# Patient Record
Sex: Female | Born: 1945
Health system: Southern US, Community
[De-identification: ages and names within clinical notes are randomized; demographics above are authoritative.]

## PROBLEM LIST (undated history)

## (undated) DIAGNOSIS — E559 Vitamin D deficiency, unspecified: Secondary | ICD-10-CM

## (undated) DIAGNOSIS — I422 Other hypertrophic cardiomyopathy: Secondary | ICD-10-CM

## (undated) DIAGNOSIS — F419 Anxiety disorder, unspecified: Secondary | ICD-10-CM

## (undated) DIAGNOSIS — C679 Malignant neoplasm of bladder, unspecified: Secondary | ICD-10-CM

## (undated) DIAGNOSIS — F32A Depression, unspecified: Secondary | ICD-10-CM

## (undated) DIAGNOSIS — N390 Urinary tract infection, site not specified: Secondary | ICD-10-CM

## (undated) DIAGNOSIS — G5602 Carpal tunnel syndrome, left upper limb: Secondary | ICD-10-CM

## (undated) DIAGNOSIS — K219 Gastro-esophageal reflux disease without esophagitis: Secondary | ICD-10-CM

## (undated) DIAGNOSIS — E119 Type 2 diabetes mellitus without complications: Secondary | ICD-10-CM

## (undated) DIAGNOSIS — K632 Fistula of intestine: Secondary | ICD-10-CM

## (undated) DIAGNOSIS — D509 Iron deficiency anemia, unspecified: Secondary | ICD-10-CM

## (undated) DIAGNOSIS — J449 Chronic obstructive pulmonary disease, unspecified: Secondary | ICD-10-CM

## (undated) DIAGNOSIS — E785 Hyperlipidemia, unspecified: Secondary | ICD-10-CM

## (undated) DIAGNOSIS — M1990 Unspecified osteoarthritis, unspecified site: Secondary | ICD-10-CM

## (undated) DIAGNOSIS — I10 Essential (primary) hypertension: Secondary | ICD-10-CM

## (undated) DIAGNOSIS — F329 Major depressive disorder, single episode, unspecified: Secondary | ICD-10-CM

## (undated) HISTORY — DX: Urinary tract infection, site not specified: N39.0

## (undated) HISTORY — DX: Iron deficiency anemia, unspecified: D50.9

## (undated) HISTORY — DX: Carpal tunnel syndrome, left upper limb: G56.02

## (undated) HISTORY — DX: Gastro-esophageal reflux disease without esophagitis: K21.9

## (undated) HISTORY — DX: Anxiety disorder, unspecified: F41.9

## (undated) HISTORY — PX: ESOPHAGEAL DILATION: SHX303

## (undated) HISTORY — DX: Hyperlipidemia, unspecified: E78.5

## (undated) HISTORY — DX: Vitamin D deficiency, unspecified: E55.9

## (undated) HISTORY — PX: CHOLECYSTECTOMY: SHX55

## (undated) HISTORY — DX: Other hypertrophic cardiomyopathy: I42.2

## (undated) HISTORY — DX: Malignant neoplasm of bladder, unspecified: C67.9

## (undated) HISTORY — DX: Chronic obstructive pulmonary disease, unspecified: J44.9

## (undated) HISTORY — DX: Essential (primary) hypertension: I10

## (undated) HISTORY — DX: Fistula of intestine: K63.2

## (undated) HISTORY — DX: Major depressive disorder, single episode, unspecified: F32.9

## (undated) HISTORY — PX: TOTAL HIP ARTHROPLASTY: SHX124

## (undated) HISTORY — DX: Unspecified osteoarthritis, unspecified site: M19.90

## (undated) HISTORY — DX: Type 2 diabetes mellitus without complications: E11.9

## (undated) HISTORY — DX: Depression, unspecified: F32.A

---

## 2010-05-29 DIAGNOSIS — K219 Gastro-esophageal reflux disease without esophagitis: Secondary | ICD-10-CM | POA: Insufficient documentation

## 2010-05-29 DIAGNOSIS — E118 Type 2 diabetes mellitus with unspecified complications: Secondary | ICD-10-CM | POA: Insufficient documentation

## 2010-05-29 DIAGNOSIS — I10 Essential (primary) hypertension: Secondary | ICD-10-CM | POA: Insufficient documentation

## 2010-05-29 DIAGNOSIS — E559 Vitamin D deficiency, unspecified: Secondary | ICD-10-CM | POA: Insufficient documentation

## 2010-05-29 DIAGNOSIS — E785 Hyperlipidemia, unspecified: Secondary | ICD-10-CM | POA: Insufficient documentation

## 2010-05-29 DIAGNOSIS — D500 Iron deficiency anemia secondary to blood loss (chronic): Secondary | ICD-10-CM | POA: Insufficient documentation

## 2010-05-29 DIAGNOSIS — N390 Urinary tract infection, site not specified: Secondary | ICD-10-CM | POA: Insufficient documentation

## 2010-05-29 DIAGNOSIS — N3941 Urge incontinence: Secondary | ICD-10-CM | POA: Insufficient documentation

## 2010-05-29 DIAGNOSIS — F419 Anxiety disorder, unspecified: Secondary | ICD-10-CM | POA: Insufficient documentation

## 2010-05-29 DIAGNOSIS — F32A Depression, unspecified: Secondary | ICD-10-CM | POA: Insufficient documentation

## 2010-05-29 DIAGNOSIS — F329 Major depressive disorder, single episode, unspecified: Secondary | ICD-10-CM

## 2015-05-08 DIAGNOSIS — Z720 Tobacco use: Secondary | ICD-10-CM | POA: Insufficient documentation

## 2015-05-08 DIAGNOSIS — D751 Secondary polycythemia: Secondary | ICD-10-CM | POA: Insufficient documentation

## 2017-09-10 DIAGNOSIS — J449 Chronic obstructive pulmonary disease, unspecified: Secondary | ICD-10-CM | POA: Insufficient documentation

## 2017-09-13 DIAGNOSIS — Z6841 Body Mass Index (BMI) 40.0 and over, adult: Secondary | ICD-10-CM

## 2017-10-17 DIAGNOSIS — I5032 Chronic diastolic (congestive) heart failure: Secondary | ICD-10-CM | POA: Insufficient documentation

## 2017-10-17 DIAGNOSIS — I421 Obstructive hypertrophic cardiomyopathy: Secondary | ICD-10-CM | POA: Insufficient documentation

## 2018-09-22 ENCOUNTER — Telehealth: Payer: Self-pay

## 2018-09-22 NOTE — Telephone Encounter (Signed)
Dr. Larose Kells did approve these. Please schedule visits at her convenience and her husbands convenience please.

## 2018-09-22 NOTE — Telephone Encounter (Signed)
Appt scheduled 10/27/2018.

## 2018-09-22 NOTE — Telephone Encounter (Signed)
Copied from [place] [id number]. Topic: Appointment Scheduling - Scheduling Inquiry for Clinic >> Sep 22, 2018  1:01 PM Sheran Luz wrote: Reason for CRM: Patient states that she and her husband were both accepted as new patients by Dr. Larose Kells through her daughter and son in law who are currently patients. I could find no documentation in either chart [name] and Corene Cornea Mieding). Please contact patient to schedule?

## 2018-10-04 NOTE — Telephone Encounter (Addendum)
Pt and spouse called in to follow up on apt request. Advised pt to have Med records sent over to Dr. Larose Kells. Called office to be advised about apt and was told that pt and spouse would need to be scheduled after May 15, made pt aware. They would like to be advised on what should they do about their Rx medications meanwhile awaiting apt's?  ALSO, they would like to have apt back to back if possible.   Please assist with scheduling. Pt's spouse MRN is [id number]   [phone]

## 2018-10-04 NOTE — Telephone Encounter (Signed)
Appt scheduled 10/25/2018. Okay to bring Pt and husband into clinic for visits as long as they are not having resp symptoms.

## 2018-10-25 ENCOUNTER — Other Ambulatory Visit: Payer: Self-pay

## 2018-10-25 ENCOUNTER — Ambulatory Visit (INDEPENDENT_AMBULATORY_CARE_PROVIDER_SITE_OTHER): Payer: Medicare Other | Admitting: Internal Medicine

## 2018-10-25 ENCOUNTER — Encounter: Payer: Self-pay | Admitting: Internal Medicine

## 2018-10-25 DIAGNOSIS — F419 Anxiety disorder, unspecified: Secondary | ICD-10-CM | POA: Diagnosis not present

## 2018-10-25 DIAGNOSIS — F329 Major depressive disorder, single episode, unspecified: Secondary | ICD-10-CM

## 2018-10-25 DIAGNOSIS — I421 Obstructive hypertrophic cardiomyopathy: Secondary | ICD-10-CM

## 2018-10-25 DIAGNOSIS — D500 Iron deficiency anemia secondary to blood loss (chronic): Secondary | ICD-10-CM

## 2018-10-25 DIAGNOSIS — I10 Essential (primary) hypertension: Secondary | ICD-10-CM | POA: Diagnosis not present

## 2018-10-25 DIAGNOSIS — I5032 Chronic diastolic (congestive) heart failure: Secondary | ICD-10-CM

## 2018-10-25 DIAGNOSIS — J449 Chronic obstructive pulmonary disease, unspecified: Secondary | ICD-10-CM

## 2018-10-25 DIAGNOSIS — E118 Type 2 diabetes mellitus with unspecified complications: Secondary | ICD-10-CM

## 2018-10-25 DIAGNOSIS — F32A Depression, unspecified: Secondary | ICD-10-CM

## 2018-10-25 NOTE — Progress Notes (Signed)
Subjective:    Patient ID: Becky Stanley, female    DOB: [birth year], 73 y.o.   MRN: [id number]  DOS:  10/25/2018 Type of visit - description: Virtual Visit via Video Note  I connected with@ on 10/26/18 at  1:40 PM EDT by a video enabled telemedicine application and verified that I am speaking with the correct person using two identifiers.   THIS ENCOUNTER IS A VIRTUAL VISIT DUE TO COVID-19 - PATIENT WAS NOT SEEN IN THE OFFICE. PATIENT HAS CONSENTED TO VIRTUAL VISIT / TELEMEDICINE VISIT   Location of patient: home  Location of provider: office  I discussed the limitations of evaluation and management by telemedicine and the availability of in person appointments. The patient expressed understanding and agreed to proceed.  History of Present Illness: New patient The patient reports that she just moved to this area a few days ago, was referred to my practice by [name], her daughter. We went over her medications and her extensive PMH.  Available information from care everywhere reviewed.  Review of Systems No fever chills Slightly increasing cough for the last few days, reaching out for her inhaler a little more often. Reports episodic nausea, diarrhea, chronic issue, at baseline.  Past Medical History:  Diagnosis Date  . Anxiety and depression   . Carpal tunnel syndrome, left   . Chronic UTI, h/o   . Diabetes mellitus ([place])   . DJD (degenerative joint disease)   . Essential hypertension   . GERD (gastroesophageal reflux disease)   . Hyperlipidemia   . Iron deficiency anemia   . Vitamin D deficiency      Social History   Socioeconomic History  . Marital status: Married    Spouse name: Dominica Stanley  . Number of children: 2  . Years of education: Not on file  . Highest education level: Not on file  Occupational History  . Occupation: retired  Scientific laboratory technician  . Financial resource strain: Not on file  . Food insecurity:    Worry: Not on file    Inability: Not on file  .  Transportation needs:    Medical: Not on file    Non-medical: Not on file  Tobacco Use  . Smoking status: Current Some Day Smoker  . Smokeless tobacco: Never Used  . Tobacco comment: quit 20 years, started back about 5 years ago, vaping  Substance and Sexual Activity  . Alcohol use: Never    Frequency: Never  . Drug use: Not on file  . Sexual activity: Not on file  Lifestyle  . Physical activity:    Days per week: Not on file    Minutes per session: Not on file  . Stress: Not on file  Relationships  . Social connections:    Talks on phone: Not on file    Gets together: Not on file    Attends religious service: Not on file    Active member of club or organization: Not on file    Attends meetings of clubs or organizations: Not on file    Relationship status: Not on file  . Intimate partner violence:    Fear of current or ex partner: Not on file    Emotionally abused: Not on file    Physically abused: Not on file    Forced sexual activity: Not on file  Other Topics Concern  . Not on file  Social History Narrative   Moved from [place] to the [place] area home 10/12/2018 to live with her daughter.  Former smoker, as of 10-2018 vaping with plans to quit     Family History  Problem Relation Age of Onset  . Colon cancer Father 5  . CAD Paternal Grandfather   . Breast cancer Neg Hx      Allergies as of 10/25/2018      Reactions   Aminoglycosides    Ciprofloxacin Nausea And Vomiting   Tolerates levaquin   Erythromycin Nausea And Vomiting   Nitrofurantoin Nausea And Vomiting   Other Nausea And Vomiting   Mussels   Penicillins Hives      Medication List       Accurate as of October 25, 2018 11:59 PM. Always use your most recent med list.        clonazePAM 1 MG tablet Commonly known as:  KLONOPIN Take 1-2 mg by mouth at bedtime as needed.   Drisdol 1.25 MG (50000 UT) capsule Generic drug:  ergocalciferol Take 1 capsule by mouth once a week.   Dulera 200-5  MCG/ACT Aero Generic drug:  mometasone-formoterol Inhale 2 puffs into the lungs 2 (two) times daily.   esomeprazole 20 MG capsule Commonly known as:  NEXIUM Take 20 mg by mouth 2 (two) times daily before a meal.   furosemide 20 MG tablet Commonly known as:  LASIX Take 40 mg by mouth daily.   glimepiride 4 MG tablet Commonly known as:  AMARYL Take 1/2 tablet every morning and 1 tablet by mouth at night   HYDROcodone-acetaminophen 5-325 MG tablet Commonly known as:  NORCO/VICODIN Take 1 tablet by mouth every 6 (six) hours as needed for moderate pain.   levofloxacin 250 MG tablet Commonly known as:  LEVAQUIN Take 250 mg by mouth daily.   lovastatin 40 MG tablet Commonly known as:  MEVACOR Take 40 mg by mouth at bedtime.   metFORMIN 500 MG 24 hr tablet Commonly known as:  GLUCOPHAGE-XR Take 1,000 mg by mouth 2 (two) times daily.   metoprolol tartrate 50 MG tablet Commonly known as:  LOPRESSOR Take 1 tablet by mouth 2 (two) times daily.   ProAir HFA 108 (90 Base) MCG/ACT inhaler Generic drug:  albuterol Inhale 2 puffs into the lungs 4 (four) times daily as needed.   sertraline 100 MG tablet Commonly known as:  ZOLOFT Take 100 mg by mouth daily.           Objective:   Physical Exam There were no vitals taken for this visit. This is a video conference, she is alert oriented x3 in no distress    Assessment    Assessmnt (new patient 10-2018 referred by her daughter [name]) DM HTN High cholesterol Anxiety: On sertraline, clonazepam Pulmonary: Asthma, COPD, smoker/vaping ----admitted 06/2017: Acute hypoxic respiratory failure ----CT of the chest in August 2017 which revealed mild groundglass changes primarily in the upper lobes with no noted masses, nodules, or lesions Iron deficiency anemia CV: ----CHF DX 06/2017 ----HCOM (with excessive diuresis) ----Arrhythmia (used to see cards) DJD Migraines: On hydrocodone as needed Recurrent UTIs: At some point saw  infectious diseases, was Rx Levaquin for 3 days as needed.  She is allergic to Cipro but tolerates Levaquin.  As of 10-2018, UTIs are less frequent.  PLAN: DM: Currently on glimepiride, metformin.  Will need labs at the next opportunity.  Ambulatory CBGs in the mornings 150s, in the afternoon 116's HTN: Currently on Lasix, metoprolol. amb bps wnl High cholesterol: On lovastatin, labs on RTC COPD asthma: She is a former smoker, currently vaping, planning to quit soon.  Glade Stanford and has a rescue inhaler.  Symptoms are stable, not severe but slightly increased in the last few days, she just moved to this area to live with her daughter and the house is somewhat dusty due to renovations. UTIs: Currently asymptomatic Anxiety: Currently on sertraline, typically takes clonazepam 1.5 tablet daily.  We agreed to call for RF when needed.  UDS and contract when we meet face-to-face. Migraines: Having migraines seldom, current strategy is hydrocodone, typically 30 tablets last 1 year.  Will call for Rx when needed CHF, HOCM : Last visit with cardiology 08/14/2018, at the time she was felt to be doing well and was recommended to continue low-dose diuretics.  They recommended avoid overdiuresis due to hypertrophic cardiomyopathy Iron deficiency anemia: Saw hematology 01-2017, at the time she was recommended a GI work-up to rule out sources of chronic blood loss (was reluctant to proceed).  She was intolerant to oral iron. Leukocytosis with neutrophilia: Saw hematology 01-2017, likely reactive. RTC: My staff will call and set up a face-to-face meeting in 6 weeks  Today, I spent more than 33   min with the patient: >50% of the time counseling regards her chronic medical issues, reviewing extensively her chart.   I discussed the assessment and treatment plan with the patient. The patient was provided an opportunity to ask questions and all were answered. The patient agreed with the plan and demonstrated an  understanding of the instructions.   The patient was advised to call back or seek an in-person evaluation if the symptoms worsen or if the condition fails to improve as anticipated.

## 2018-10-26 ENCOUNTER — Encounter: Payer: Self-pay | Admitting: Internal Medicine

## 2018-10-26 DIAGNOSIS — Z09 Encounter for follow-up examination after completed treatment for conditions other than malignant neoplasm: Secondary | ICD-10-CM | POA: Insufficient documentation

## 2018-10-26 NOTE — Assessment & Plan Note (Signed)
DM: Currently on glimepiride, metformin.  Will need labs at the next opportunity.  Ambulatory CBGs in the mornings 150s, in the afternoon 116's HTN: Currently on Lasix, metoprolol. amb bps wnl High cholesterol: On lovastatin, labs on RTC COPD asthma: She is a former smoker, currently vaping, planning to quit soon.  Glade Stanford and has a rescue inhaler.  Symptoms are stable, not severe but slightly increased in the last few days, she just moved to this area to live with her daughter and the house is somewhat dusty due to renovations. UTIs: Currently asymptomatic Anxiety: Currently on sertraline, typically takes clonazepam 1.5 tablet daily.  We agreed to call for RF when needed.  UDS and contract when we meet face-to-face. Migraines: Having migraines seldom, current strategy is hydrocodone, typically 30 tablets last 1 year.  Will call for Rx when needed CHF, HOCM : Last visit with cardiology 08/14/2018, at the time she was felt to be doing well and was recommended to continue low-dose diuretics.  They recommended avoid overdiuresis due to hypertrophic cardiomyopathy Iron deficiency anemia: Saw hematology 01-2017, at the time she was recommended a GI work-up to rule out sources of chronic blood loss (was reluctant to proceed).  She was intolerant to oral iron. Leukocytosis with neutrophilia: Saw hematology 01-2017, likely reactive. RTC: My staff will call and set up a face-to-face meeting in 6 weeks

## 2018-10-27 ENCOUNTER — Ambulatory Visit: Payer: Self-pay | Admitting: Internal Medicine

## 2018-11-24 ENCOUNTER — Other Ambulatory Visit: Payer: Self-pay

## 2018-11-24 MED ORDER — GLUCOSE BLOOD VI STRP: ORAL_STRIP | 12 refills | Status: DC

## 2018-11-28 ENCOUNTER — Other Ambulatory Visit: Payer: Self-pay | Admitting: Internal Medicine

## 2018-12-02 ENCOUNTER — Telehealth: Payer: Self-pay | Admitting: Internal Medicine

## 2018-12-02 DIAGNOSIS — F419 Anxiety disorder, unspecified: Secondary | ICD-10-CM

## 2018-12-05 NOTE — Telephone Encounter (Signed)
Clonazepam refill.   New Pt as of 10/25/2018.

## 2018-12-05 NOTE — Telephone Encounter (Signed)
Sent!

## 2018-12-25 ENCOUNTER — Telehealth: Payer: Self-pay | Admitting: Internal Medicine

## 2018-12-25 MED ORDER — DULERA 200-5 MCG/ACT IN AERO: 2.0000 | INHALATION_SPRAY | Freq: Two times a day (BID) | RESPIRATORY_TRACT | 3 refills | Status: DC

## 2018-12-25 NOTE — Telephone Encounter (Signed)
Pt's grandson dropped off document to be filled out by provider (document in a white envelope provider to have it mailed also when ready) Document put at front office tray under providers name.

## 2018-12-26 DIAGNOSIS — Z0279 Encounter for issue of other medical certificate: Secondary | ICD-10-CM

## 2018-12-26 NOTE — Telephone Encounter (Signed)
Forms completed and mailed in envelope provided. Copy of form sent for scanning. Awaiting determination.

## 2018-12-26 NOTE — Telephone Encounter (Signed)
Forms completed, awaiting MD signature in red folder.

## 2018-12-26 NOTE — Telephone Encounter (Signed)
signed

## 2018-12-29 ENCOUNTER — Telehealth: Payer: Self-pay | Admitting: Internal Medicine

## 2018-12-29 MED ORDER — ALBUTEROL SULFATE 0.63 MG/3ML IN NEBU: 1.0000 | INHALATION_SOLUTION | Freq: Four times a day (QID) | RESPIRATORY_TRACT | 2 refills | Status: DC | PRN

## 2018-12-29 NOTE — Telephone Encounter (Signed)
Is okay, send a month supply with 2 refills.  Use it 4 times a day as needed. Has a history of COPD and asthma

## 2018-12-29 NOTE — Telephone Encounter (Signed)
Please advise not on med list 

## 2018-12-29 NOTE — Telephone Encounter (Signed)
Rx sent 

## 2018-12-29 NOTE — Addendum Note (Signed)
Addended byDamita Dunnings D on: 12/29/2018 12:49 PM   Modules accepted: Orders

## 2018-12-29 NOTE — Telephone Encounter (Signed)
Pt is calling and needs a new rx albuterol sulfate inhalation solution 0.63mg /3 ml. [place]

## 2018-12-29 NOTE — Telephone Encounter (Signed)
Pt is aware waiting on determination

## 2019-01-10 ENCOUNTER — Other Ambulatory Visit: Payer: Self-pay | Admitting: *Deleted

## 2019-01-10 DIAGNOSIS — R6889 Other general symptoms and signs: Secondary | ICD-10-CM | POA: Diagnosis not present

## 2019-01-10 DIAGNOSIS — Z20822 Contact with and (suspected) exposure to covid-19: Secondary | ICD-10-CM

## 2019-01-11 LAB — NOVEL CORONAVIRUS, NAA: SARS-CoV-2, NAA: NOT DETECTED

## 2019-01-21 ENCOUNTER — Telehealth: Payer: Self-pay | Admitting: Internal Medicine

## 2019-01-21 DIAGNOSIS — F419 Anxiety disorder, unspecified: Secondary | ICD-10-CM

## 2019-01-22 NOTE — Telephone Encounter (Signed)
Sent!

## 2019-01-22 NOTE — Telephone Encounter (Signed)
Clonazepam refill.   Last OV: 10/25/2018 Last Fill; 12/05/2018 #60 and 0RF UDS: None yet, have not seen Pt in person

## 2019-01-24 ENCOUNTER — Other Ambulatory Visit: Payer: Self-pay | Admitting: Internal Medicine

## 2019-01-24 MED ORDER — DULERA 200-5 MCG/ACT IN AERO: 2.0000 | INHALATION_SPRAY | Freq: Two times a day (BID) | RESPIRATORY_TRACT | 5 refills | Status: DC

## 2019-01-24 NOTE — Telephone Encounter (Signed)
Rx sent 

## 2019-01-24 NOTE — Telephone Encounter (Signed)
Medication Refill - Medication:mometasone-formoterol (DULERA) 200-5 MCG/ACT AERO / Pt is out of medication  Has the patient contacted their pharmacy? Yes.   (Agent: If no, request that the patient contact the pharmacy for the refill.) (Agent: If yes, when and what did the pharmacy advise?)  Preferred Pharmacy (with phone number or street name):  [facility] [address] [place], [place]. [phone] (Phone) [phone] (Fax)     Agent: Please be advised that RX refills may take up to 3 business days. We ask that you follow-up with your pharmacy.

## 2019-01-26 ENCOUNTER — Telehealth: Payer: Self-pay | Admitting: Internal Medicine

## 2019-01-26 NOTE — Telephone Encounter (Signed)
Spoke w/ Pt- she is needing [place] to call [employer] at [phone] to release refill of Dulera. I called and LMOM on provider line w/ Pt name, DOB, my name and title, name of medication and my call back number.

## 2019-01-26 NOTE — Telephone Encounter (Signed)
Patient assistance approved.

## 2019-01-26 NOTE — Telephone Encounter (Signed)
Pt called stating she is needing to speak with nurse regarding a refill of medication. Pt states there are steps that need to be followed. Pt is requesting a call back today because she will be out of medication. Please advise.

## 2019-01-26 NOTE — Telephone Encounter (Signed)
Spoke w/ Almyra Free- verbal orders given for [facility].

## 2019-01-26 NOTE — Telephone Encounter (Signed)
°  Caller name: Anderson Malta  Relation to pt: [facility] Call back number: [phone]    Reason for call:  Returned call unable to reach practice. Representative seeking PCP name and NPI, information given.

## 2019-01-31 NOTE — Telephone Encounter (Signed)
Called [employer]- spoke w/ Marcille Blanco- informed that the Rx did not have quantity listed- was transferred to [facility] (pharmacist)- clarified quantity and sig- informed Levada Dy that Pt has been out for 10 days requested for Rx to be expedited- she has put request in.

## 2019-01-31 NOTE — Telephone Encounter (Signed)
Spoke w/ Pt- informed that I called and spoke w/ [employer]- clarification on Rx supposedly fixed and they should be expediting it to her- instructed her to let us know if she continues to have any issues. Informed we also do not have samples to give.

## 2019-01-31 NOTE — Telephone Encounter (Signed)
Pt calling and states that [employer] is telling her that they do not have all the information that is needed for them to process her patient assistance for [facility].  Pt would like to speak with someone about this so that she can make sure that it gets completed.  Pt states that she hasn't had any medication for about ten days now and she is really feeling it and feels that she needs this expedited. Pt can be reached at [phone]

## 2019-02-02 ENCOUNTER — Other Ambulatory Visit: Payer: Self-pay

## 2019-02-02 ENCOUNTER — Ambulatory Visit (INDEPENDENT_AMBULATORY_CARE_PROVIDER_SITE_OTHER): Payer: Medicare Other | Admitting: Internal Medicine

## 2019-02-02 DIAGNOSIS — E118 Type 2 diabetes mellitus with unspecified complications: Secondary | ICD-10-CM | POA: Diagnosis not present

## 2019-02-02 DIAGNOSIS — F32A Depression, unspecified: Secondary | ICD-10-CM

## 2019-02-02 DIAGNOSIS — F419 Anxiety disorder, unspecified: Secondary | ICD-10-CM

## 2019-02-02 DIAGNOSIS — J449 Chronic obstructive pulmonary disease, unspecified: Secondary | ICD-10-CM

## 2019-02-02 DIAGNOSIS — E785 Hyperlipidemia, unspecified: Secondary | ICD-10-CM

## 2019-02-02 DIAGNOSIS — I10 Essential (primary) hypertension: Secondary | ICD-10-CM | POA: Diagnosis not present

## 2019-02-02 DIAGNOSIS — E1169 Type 2 diabetes mellitus with other specified complication: Secondary | ICD-10-CM

## 2019-02-02 DIAGNOSIS — F329 Major depressive disorder, single episode, unspecified: Secondary | ICD-10-CM | POA: Diagnosis not present

## 2019-02-02 MED ORDER — SERTRALINE HCL 100 MG PO TABS: 150.0000 mg | ORAL_TABLET | Freq: Every day | ORAL | 0 refills | Status: DC

## 2019-02-02 NOTE — Progress Notes (Signed)
Subjective:    Patient ID: Becky Stanley, female    DOB: 05-22-1946, 73 y.o.   MRN: [id number]  DOS:  02/02/2019 Type of visit - description: Attempted  to make this a video visit, due to technical difficulties from the patient side it was not possible  thus we proceeded with a Virtual Visit via Telephone    I connected with@   by telephone and verified that I am speaking with the correct person using two identifiers.  THIS ENCOUNTER IS A VIRTUAL VISIT DUE TO COVID-19 - PATIENT WAS NOT SEEN IN THE OFFICE. PATIENT HAS CONSENTED TO VIRTUAL VISIT / TELEMEDICINE VISIT   Location of patient: home  Location of provider: office  I discussed the limitations, risks, security and privacy concerns of performing an evaluation and management service by telephone and the availability of in person appointments. I also discussed with the patient that there may be a patient responsible charge related to this service. The patient expressed understanding and agreed to proceed.   History of Present Illness: Follow-up We discussed several issues. DM: CBGs running better than few months ago.  Has made some changes on her diet COPD: Still unable to get [facility].  Cost of medicines is a problem Anxiety depression: Not optimally controlled, currently on Zoloft 100 mg daily. HTN: No ambulatory BPs  Review of Systems Denies fever chills No cough or difficulty breathing No chest pain or lower extremity edema Sleeps okay with clonazepam. Past Medical History:  Diagnosis Date  . Anxiety and depression   . Carpal tunnel syndrome, left   . Chronic UTI, h/o   . Diabetes mellitus ([place])   . DJD (degenerative joint disease)   . Essential hypertension   . GERD (gastroesophageal reflux disease)   . Hyperlipidemia   . Iron deficiency anemia   . Vitamin D deficiency     Past Surgical History:  Procedure Laterality Date  . CHOLECYSTECTOMY    . TOTAL HIP ARTHROPLASTY Bilateral     Social History    Socioeconomic History  . Marital status: Married    Spouse name: Dominica Stanley  . Number of children: 2  . Years of education: Not on file  . Highest education level: Not on file  Occupational History  . Occupation: retired  Scientific laboratory technician  . Financial resource strain: Not on file  . Food insecurity    Worry: Not on file    Inability: Not on file  . Transportation needs    Medical: Not on file    Non-medical: Not on file  Tobacco Use  . Smoking status: Current Some Day Smoker  . Smokeless tobacco: Never Used  . Tobacco comment: quit 20 years, started back about 5 years ago, vaping  Substance and Sexual Activity  . Alcohol use: Never    Frequency: Never  . Drug use: Not on file  . Sexual activity: Not on file  Lifestyle  . Physical activity    Days per week: Not on file    Minutes per session: Not on file  . Stress: Not on file  Relationships  . Social Herbalist on phone: Not on file    Gets together: Not on file    Attends religious service: Not on file    Active member of club or organization: Not on file    Attends meetings of clubs or organizations: Not on file    Relationship status: Not on file  . Intimate partner violence    Fear  of current or ex partner: Not on file    Emotionally abused: Not on file    Physically abused: Not on file    Forced sexual activity: Not on file  Other Topics Concern  . Not on file  Social History Narrative   Moved from [place] to the [place] area home 10/12/2018 to live with her daughter.   Former smoker, as of 10-2018 vaping with plans to quit      Allergies as of 02/02/2019      Reactions   Aminoglycosides    Ciprofloxacin Nausea And Vomiting   Tolerates levaquin   Erythromycin Nausea And Vomiting   Nitrofurantoin Nausea And Vomiting   Other Nausea And Vomiting   Mussels   Penicillins Hives      Medication List       Accurate as of February 02, 2019 11:59 PM. If you have any questions, ask your nurse or doctor.         albuterol 0.63 MG/3ML nebulizer solution Commonly known as: ACCUNEB Take 3 mLs (0.63 mg total) by nebulization 4 (four) times daily as needed for wheezing or shortness of breath. What changed: Another medication with the same name was removed. Continue taking this medication, and follow the directions you see here. Changed by: Kathlene November, MD   albuterol 108 (90 Base) MCG/ACT inhaler Commonly known as: VENTOLIN HFA Inhale 2 puffs into the lungs every 6 (six) hours as needed for wheezing or shortness of breath. What changed: Another medication with the same name was removed. Continue taking this medication, and follow the directions you see here. Changed by: Kathlene November, MD   clonazePAM 1 MG tablet Commonly known as: KLONOPIN Take 1 tablet (1 mg total) by mouth 2 (two) times daily as needed for anxiety.   Drisdol 1.25 MG (50000 UT) capsule Generic drug: ergocalciferol Take 1 capsule by mouth once a week.   Dulera 200-5 MCG/ACT Aero Generic drug: mometasone-formoterol Inhale 2 puffs into the lungs 2 (two) times daily.   esomeprazole 20 MG capsule Commonly known as: NEXIUM Take 20 mg by mouth 2 (two) times daily before a meal.   furosemide 20 MG tablet Commonly known as: LASIX Take 40 mg by mouth daily.   glimepiride 4 MG tablet Commonly known as: AMARYL Take 1/2 tablet every morning and 1 tablet by mouth at night   HYDROcodone-acetaminophen 5-325 MG tablet Commonly known as: NORCO/VICODIN Take 1 tablet by mouth every 6 (six) hours as needed for moderate pain.   levofloxacin 250 MG tablet Commonly known as: LEVAQUIN Take 250 mg by mouth daily.   lovastatin 40 MG tablet Commonly known as: MEVACOR Take 40 mg by mouth at bedtime.   metFORMIN 500 MG 24 hr tablet Commonly known as: GLUCOPHAGE-XR Take 1,000 mg by mouth 2 (two) times daily.   metoprolol tartrate 50 MG tablet Commonly known as: LOPRESSOR Take 1 tablet by mouth 2 (two) times daily.   OneTouch Verio test strip  Generic drug: glucose blood TEST ONCE DAILY   sertraline 100 MG tablet Commonly known as: ZOLOFT Take 1.5 tablets (150 mg total) by mouth daily.           Objective:   Physical Exam There were no vitals taken for this visit. This is a virtual phone visit.  She sounds well, alert and oriented x3    Assessment     Assessmnt (new patient 10-2018 referred by her daughter [name]) DM HTN High cholesterol Anxiety: On sertraline, clonazepam Pulmonary: Asthma, COPD, smoker/vaping ----  admitted 06/2017: Acute hypoxic respiratory failure ----CT of the chest in August 2017 which revealed mild groundglass changes primarily in the upper lobes with no noted masses, nodules, or lesions Iron deficiency anemia CV: ----CHF DX 06/2017 ----HCOM (with excessive diuresis) ----Arrhythmia (used to see cards) DJD Migraines: On hydrocodone as needed Recurrent UTIs: At some point saw infectious diseases, was Rx Levaquin for 3 days as needed.  She is allergic to Cipro but tolerates Levaquin.  As of 10-2018, UTIs are less frequent.   PLAN: DM: On glimepiride and metformin.  Months ago, CBGs used to be 190, 200.  She has improved her diet, CBG now 130, 160.  Check A1c. HTN: Currently on metoprolol, Lasix.  No ambulatory BPs, checking a CMP.  No change High cholesterol: On lovastatin, check FLP Anxiety: Reports that has tried "several medications" including Prozac, Lexapro and to others.  Currently on sertraline, feels like is not helping as much as she would like. After long conversation we agreed to increase Zoloft to 150 mg daily and reassess in 3 months. If needed we could add Wellbutrin and she will be okay with that. Asthma COPD: We are working on getting her Ruthe Mannan.  Unable to get pro-air from the manufacturer, will try Ventolin. History of anemia: Check CBC. Plan: Arrange for labs (CMP, CBC, A1c, FLP) Follow-up in 3 months        I discussed the assessment and treatment plan with the  patient. The patient was provided an opportunity to ask questions and all were answered. The patient agreed with the plan and demonstrated an understanding of the instructions.   The patient was advised to call back or seek an in-person evaluation if the symptoms worsen or if the condition fails to improve as anticipated.  I provided 28 minutes of non-face-to-face time during this encounter.  Kathlene November, MD

## 2019-02-04 DIAGNOSIS — E785 Hyperlipidemia, unspecified: Secondary | ICD-10-CM | POA: Insufficient documentation

## 2019-02-04 DIAGNOSIS — E1169 Type 2 diabetes mellitus with other specified complication: Secondary | ICD-10-CM | POA: Insufficient documentation

## 2019-02-04 NOTE — Assessment & Plan Note (Signed)
DM: On glimepiride and metformin.  Months ago, CBGs used to be 190, 200.  She has improved her diet, CBG now 130, 160.  Check A1c. HTN: Currently on metoprolol, Lasix.  No ambulatory BPs, checking a CMP.  No change High cholesterol: On lovastatin, check FLP Anxiety: Reports that has tried "several medications" including Prozac, Lexapro and to others.  Currently on sertraline, feels like is not helping as much as she would like. After long conversation we agreed to increase Zoloft to 150 mg daily and reassess in 3 months. If needed we could add Wellbutrin and she will be okay with that. Asthma COPD: We are working on getting her Ruthe Mannan.  Unable to get pro-air from the manufacturer, will try Ventolin. History of anemia: Check CBC. Plan: Arrange for labs (CMP, CBC, A1c, FLP) Follow-up in 3 months

## 2019-02-05 ENCOUNTER — Telehealth: Payer: Self-pay | Admitting: Internal Medicine

## 2019-02-05 NOTE — Telephone Encounter (Signed)
Talked to pt. She will check with her daughter and call back to schedule lab appt for next week.

## 2019-02-05 NOTE — Telephone Encounter (Signed)
-----   Message from Burna, [place] sent at 02/02/2019  2:13 PM EDT ----- Regarding: FW: Labs, prescriptions, new pharmacy Kristie- can you contact Pt to schedule lab appt please?   Vernal ----- Message ----- From: Colon Branch, MD Sent: 02/02/2019   1:47 PM EDT To: Damita Dunnings, CMA Subject: Labs, prescriptions, new pharmacy              CMP: HTN A1c: Diabetes CBC: Anemia FLP: High cholesterol  Delete pro-air, needs Ventolin 2 puffs 4 times daily as needed.  He she likes [place] to see if the manufacturer would provide it for free.  Could you work on that?. Refill sertraline 100 mg but change the dose to: 1.5 tablets daily, 14-month supply. [facility].

## 2019-02-14 ENCOUNTER — Other Ambulatory Visit: Payer: Self-pay

## 2019-02-14 MED ORDER — METOPROLOL TARTRATE 50 MG PO TABS: 50.0000 mg | ORAL_TABLET | Freq: Two times a day (BID) | ORAL | 1 refills | Status: DC

## 2019-02-14 MED ORDER — GLIMEPIRIDE 4 MG PO TABS: ORAL_TABLET | ORAL | 1 refills | Status: DC

## 2019-02-26 ENCOUNTER — Telehealth: Payer: Self-pay | Admitting: Internal Medicine

## 2019-02-26 DIAGNOSIS — F419 Anxiety disorder, unspecified: Secondary | ICD-10-CM

## 2019-02-27 NOTE — Telephone Encounter (Signed)
She has not come for her labs that was requested at the last virtual visit w/ Dr. Larose Kells- appt not needed w/ him just needs a lab appt.

## 2019-02-27 NOTE — Telephone Encounter (Signed)
Pt called to find out why this can't be filled and schedule OV for 03/02/2019.

## 2019-03-01 ENCOUNTER — Other Ambulatory Visit (INDEPENDENT_AMBULATORY_CARE_PROVIDER_SITE_OTHER): Payer: Medicare Other

## 2019-03-01 ENCOUNTER — Telehealth: Payer: Self-pay

## 2019-03-01 ENCOUNTER — Other Ambulatory Visit: Payer: Self-pay

## 2019-03-01 DIAGNOSIS — I10 Essential (primary) hypertension: Secondary | ICD-10-CM

## 2019-03-01 DIAGNOSIS — E118 Type 2 diabetes mellitus with unspecified complications: Secondary | ICD-10-CM

## 2019-03-01 DIAGNOSIS — F419 Anxiety disorder, unspecified: Secondary | ICD-10-CM

## 2019-03-01 LAB — CBC WITH DIFFERENTIAL/PLATELET
Basophils Absolute: 0.1 10*3/uL (ref 0.0–0.1)
Basophils Relative: 1 % (ref 0.0–3.0)
Eosinophils Absolute: 0.3 10*3/uL (ref 0.0–0.7)
Eosinophils Relative: 2.7 % (ref 0.0–5.0)
HCT: 39.9 % (ref 36.0–46.0)
Hemoglobin: 13.2 g/dL (ref 12.0–15.0)
Lymphocytes Relative: 19 % (ref 12.0–46.0)
Lymphs Abs: 1.8 10*3/uL (ref 0.7–4.0)
MCHC: 33.2 g/dL (ref 30.0–36.0)
MCV: 88 fl (ref 78.0–100.0)
Monocytes Absolute: 0.7 10*3/uL (ref 0.1–1.0)
Monocytes Relative: 7.4 % (ref 3.0–12.0)
Neutro Abs: 6.8 10*3/uL (ref 1.4–7.7)
Neutrophils Relative %: 69.9 % (ref 43.0–77.0)
Platelets: 253 10*3/uL (ref 150.0–400.0)
RBC: 4.53 Mil/uL (ref 3.87–5.11)
RDW: 14.1 % (ref 11.5–15.5)
WBC: 9.7 10*3/uL (ref 4.0–10.5)

## 2019-03-01 LAB — COMPREHENSIVE METABOLIC PANEL
ALT: 40 U/L — ABNORMAL HIGH (ref 0–35)
AST: 48 U/L — ABNORMAL HIGH (ref 0–37)
Albumin: 4.2 g/dL (ref 3.5–5.2)
Alkaline Phosphatase: 44 U/L (ref 39–117)
BUN: 13 mg/dL (ref 6–23)
CO2: 25 mEq/L (ref 19–32)
Calcium: 9.1 mg/dL (ref 8.4–10.5)
Chloride: 98 mEq/L (ref 96–112)
Creatinine, Ser: 0.76 mg/dL (ref 0.40–1.20)
GFR: 74.81 mL/min (ref >60.00)
Glucose, Bld: 122 mg/dL — ABNORMAL HIGH (ref 70–99)
Potassium: 3.9 mEq/L (ref 3.5–5.1)
Sodium: 135 mEq/L (ref 135–145)
Total Bilirubin: 0.4 mg/dL (ref 0.2–1.2)
Total Protein: 6.7 g/dL (ref 6.0–8.3)

## 2019-03-01 LAB — LIPID PANEL
Cholesterol: 141 mg/dL (ref 0–200)
HDL: 40 mg/dL (ref >39.00)
LDL Cholesterol: 74 mg/dL (ref 0–99)
NonHDL: 100.88
Total CHOL/HDL Ratio: 4
Triglycerides: 134 mg/dL (ref 0.0–149.0)
VLDL: 26.8 mg/dL (ref 0.0–40.0)

## 2019-03-01 LAB — HEMOGLOBIN A1C: Hgb A1c MFr Bld: 7.4 % — ABNORMAL HIGH (ref 4.6–6.5)

## 2019-03-01 NOTE — Telephone Encounter (Signed)
Clonazepam rx  Last OV: 02/02/2019 Last Fill: 01/22/2019 #60 and 0RF UDS; None

## 2019-03-02 ENCOUNTER — Ambulatory Visit: Payer: Medicare Other | Admitting: Internal Medicine

## 2019-03-02 MED ORDER — CLONAZEPAM 1 MG PO TABS: 1.0000 mg | ORAL_TABLET | Freq: Two times a day (BID) | ORAL | 1 refills | Status: DC | PRN

## 2019-03-02 NOTE — Telephone Encounter (Signed)
sent 

## 2019-03-24 ENCOUNTER — Other Ambulatory Visit: Payer: Self-pay | Admitting: Internal Medicine

## 2019-04-16 ENCOUNTER — Other Ambulatory Visit: Payer: Self-pay

## 2019-04-16 MED ORDER — METFORMIN HCL ER 500 MG PO TB24: 1000.0000 mg | ORAL_TABLET | Freq: Two times a day (BID) | ORAL | 3 refills | Status: DC

## 2019-04-19 ENCOUNTER — Other Ambulatory Visit: Payer: Self-pay | Admitting: Internal Medicine

## 2019-04-19 NOTE — Telephone Encounter (Signed)
Medication: lovastatin (MEVACOR) 40 MG tablet [id number]   Has the patient contacted their pharmacy? Yes  (Agent: If no, request that the patient contact the pharmacy for the refill.) (Agent: If yes, when and what did the pharmacy advise?)  Preferred Pharmacy (with phone number or street name[id number] [place] [place] [phone] (Phone) [phone] (Fax)    Agent: Please be advised that RX refills may take up to 3 business days. We ask that you follow-up with your pharmacy.

## 2019-04-22 ENCOUNTER — Other Ambulatory Visit: Payer: Self-pay | Admitting: Internal Medicine

## 2019-04-25 ENCOUNTER — Telehealth: Payer: Self-pay | Admitting: Internal Medicine

## 2019-04-25 DIAGNOSIS — F419 Anxiety disorder, unspecified: Secondary | ICD-10-CM

## 2019-04-25 NOTE — Telephone Encounter (Signed)
Clonazepam refill.   Last OV: 02/02/2019  Last Fill: 03/02/2019 #60 and 1RF Pt sig: 1 tab bid prn UDS: None

## 2019-04-25 NOTE — Telephone Encounter (Signed)
sent 

## 2019-05-10 ENCOUNTER — Other Ambulatory Visit: Payer: Self-pay | Admitting: Internal Medicine

## 2019-05-11 ENCOUNTER — Encounter: Payer: Self-pay | Admitting: Internal Medicine

## 2019-05-14 ENCOUNTER — Other Ambulatory Visit: Payer: Self-pay

## 2019-05-15 ENCOUNTER — Ambulatory Visit: Payer: Medicare Other | Admitting: Internal Medicine

## 2019-05-17 ENCOUNTER — Other Ambulatory Visit: Payer: Self-pay | Admitting: Internal Medicine

## 2019-05-18 ENCOUNTER — Ambulatory Visit: Payer: Medicare Other | Admitting: Internal Medicine

## 2019-05-21 ENCOUNTER — Telehealth: Payer: Self-pay | Admitting: Internal Medicine

## 2019-05-21 MED ORDER — MUPIROCIN 2 % EX OINT: 1.0000 "application " | TOPICAL_OINTMENT | Freq: Two times a day (BID) | CUTANEOUS | 0 refills | Status: DC | PRN

## 2019-05-21 NOTE — Telephone Encounter (Signed)
Patient requesting mupirocin ointment uses 2 percent for rash / breakdown in crack area, would like Rx today, please advise patient directly  [id number] [place] [place]

## 2019-05-21 NOTE — Telephone Encounter (Signed)
Sent!

## 2019-05-21 NOTE — Telephone Encounter (Signed)
Please advise 

## 2019-06-08 ENCOUNTER — Other Ambulatory Visit: Payer: Self-pay | Admitting: Internal Medicine

## 2019-06-14 ENCOUNTER — Other Ambulatory Visit: Payer: Self-pay

## 2019-06-15 ENCOUNTER — Encounter: Payer: Self-pay | Admitting: Internal Medicine

## 2019-06-15 ENCOUNTER — Telehealth: Payer: Self-pay

## 2019-06-15 ENCOUNTER — Ambulatory Visit (HOSPITAL_BASED_OUTPATIENT_CLINIC_OR_DEPARTMENT_OTHER)
Admission: RE | Admit: 2019-06-15 | Discharge: 2019-06-15 | Disposition: A | Discharge status: Home / Self Care | Payer: Medicare Other | Source: Ambulatory Visit | Attending: Internal Medicine | Admitting: Internal Medicine

## 2019-06-15 ENCOUNTER — Ambulatory Visit (INDEPENDENT_AMBULATORY_CARE_PROVIDER_SITE_OTHER): Payer: Medicare Other | Admitting: Internal Medicine

## 2019-06-15 VITALS — BP 122/47 | HR 100 | Temp 97.1°F | Resp 18 | Ht 63.5 in

## 2019-06-15 DIAGNOSIS — F1011 Alcohol abuse, in remission: Secondary | ICD-10-CM | POA: Diagnosis not present

## 2019-06-15 DIAGNOSIS — R0602 Shortness of breath: Secondary | ICD-10-CM | POA: Diagnosis not present

## 2019-06-15 DIAGNOSIS — I10 Essential (primary) hypertension: Secondary | ICD-10-CM | POA: Diagnosis not present

## 2019-06-15 DIAGNOSIS — F32A Depression, unspecified: Secondary | ICD-10-CM

## 2019-06-15 DIAGNOSIS — E118 Type 2 diabetes mellitus with unspecified complications: Secondary | ICD-10-CM

## 2019-06-15 DIAGNOSIS — F419 Anxiety disorder, unspecified: Secondary | ICD-10-CM

## 2019-06-15 DIAGNOSIS — F329 Major depressive disorder, single episode, unspecified: Secondary | ICD-10-CM | POA: Diagnosis not present

## 2019-06-15 DIAGNOSIS — R918 Other nonspecific abnormal finding of lung field: Secondary | ICD-10-CM | POA: Diagnosis not present

## 2019-06-15 DIAGNOSIS — I5032 Chronic diastolic (congestive) heart failure: Secondary | ICD-10-CM

## 2019-06-15 MED ORDER — BUPROPION HCL 100 MG PO TABS: 100.0000 mg | ORAL_TABLET | Freq: Two times a day (BID) | ORAL | 3 refills | Status: DC

## 2019-06-15 NOTE — Telephone Encounter (Signed)
Pt assistance forms received from Pt today. Will work on them on return to office Monday.

## 2019-06-15 NOTE — Patient Instructions (Addendum)
Please schedule Medicare Wellness with Glenard Haring.   Per our records you are due for an eye exam. Please contact your eye doctor to schedule an appointment. Please have them send copies of your office visit notes to [place]. Our fax number is [phone].   GO TO THE FRONT DESK Schedule your next appointment   for a checkup in 2 months  Continue the same medications  Add Wellbutrin 100 mg: 1 tablet at bedtime for 1 week then 1 tablet twice a day  Proceed with high-dose flu shot and pneumonia shot (PNM 23) at your pharmacy.

## 2019-06-15 NOTE — Progress Notes (Signed)
Pre visit review using our clinic review tool, if applicable. No additional management support is needed unless otherwise documented below in the visit note. 

## 2019-06-15 NOTE — Progress Notes (Signed)
Subjective:    Patient ID: Becky Stanley, female    DOB: 1946-04-17, 73 y.o.   MRN: [id number]  DOS:  06/15/2019 Type of visit - description: Follow-up DM: Admits to poor diet and no exercise, CBGs in the 200 DJD: On hydrocodone sporadically Anxiety and depression: Rx sertraline few months ago, symptoms improved, however in the last 6 to 8 weeks, depression has returned.  It is related to the quarantine, they moved to this area and have no friends, she is very social.  No suicidal ideas Fatigue: Reports mostly tiredness, from time to time she gets short of breath after taking a shower.  She uses a scooter so she does not exert herself much. History of asthma: Minimal symptoms EtOH: Today for the first time she reports h/o EtOH abuse, has been sober for 16 years.    Review of Systems Denies fever chills No chest pain Mild edema at baseline. No nausea, vomiting, diarrhea Cough is rare, some wheezing only 3-4 times a week.   Past Medical History:  Diagnosis Date  . Anxiety and depression   . Carpal tunnel syndrome, left   . Chronic UTI, h/o   . Diabetes mellitus ([place])   . DJD (degenerative joint disease)   . Essential hypertension   . GERD (gastroesophageal reflux disease)   . Hyperlipidemia   . Iron deficiency anemia   . Vitamin D deficiency     Past Surgical History:  Procedure Laterality Date  . CHOLECYSTECTOMY    . TOTAL HIP ARTHROPLASTY Bilateral     Social History   Socioeconomic History  . Marital status: Married    Spouse name: Dominica Stanley  . Number of children: 2  . Years of education: Not on file  . Highest education level: Not on file  Occupational History  . Occupation: retired  Scientific laboratory technician  . Financial resource strain: Not on file  . Food insecurity    Worry: Not on file    Inability: Not on file  . Transportation needs    Medical: Not on file    Non-medical: Not on file  Tobacco Use  . Smoking status: Current Some Day Smoker  . Smokeless tobacco:  Never Used  . Tobacco comment: quit 20 years, started back about 5 years ago, vaping  Substance and Sexual Activity  . Alcohol use: Never    Frequency: Never    Comment: h/o ETOH abuse   . Drug use: Not on file  . Sexual activity: Not on file  Lifestyle  . Physical activity    Days per week: Not on file    Minutes per session: Not on file  . Stress: Not on file  Relationships  . Social Herbalist on phone: Not on file    Gets together: Not on file    Attends religious service: Not on file    Active member of club or organization: Not on file    Attends meetings of clubs or organizations: Not on file    Relationship status: Not on file  . Intimate partner violence    Fear of current or ex partner: Not on file    Emotionally abused: Not on file    Physically abused: Not on file    Forced sexual activity: Not on file  Other Topics Concern  . Not on file  Social History Narrative   Moved from [place] to the [place] area home 10/12/2018 to live with her daughter.   Former smoker, as of  10-2018 vaping with plans to quit      Allergies as of 06/15/2019      Reactions   Aminoglycosides    Ciprofloxacin Nausea And Vomiting   Tolerates levaquin   Erythromycin Nausea And Vomiting   Nitrofurantoin Nausea And Vomiting   Other Nausea And Vomiting   Mussels   Penicillins Hives      Medication List       Accurate as of June 15, 2019 11:59 PM. If you have any questions, ask your nurse or doctor.        albuterol 0.63 MG/3ML nebulizer solution Commonly known as: ACCUNEB Take 3 mLs (0.63 mg total) by nebulization 4 (four) times daily as needed for wheezing or shortness of breath.   albuterol 108 (90 Base) MCG/ACT inhaler Commonly known as: VENTOLIN HFA Inhale 2 puffs into the lungs every 6 (six) hours as needed for wheezing or shortness of breath.   buPROPion 100 MG tablet Commonly known as: WELLBUTRIN Take 1 tablet (100 mg total) by mouth 2 (two) times daily.  Started by: Kathlene November, MD   clonazePAM 1 MG tablet Commonly known as: KLONOPIN TAKE 1 TABLET (1 MG TOTAL) BY MOUTH 2 (TWO) TIMES DAILY AS NEEDED FOR ANXIETY.   Drisdol 1.25 MG (50000 UT) capsule Generic drug: ergocalciferol Take 1 capsule by mouth once a week.   Dulera 200-5 MCG/ACT Aero Generic drug: mometasone-formoterol Inhale 2 puffs into the lungs 2 (two) times daily.   esomeprazole 20 MG capsule Commonly known as: NEXIUM Take 20 mg by mouth 2 (two) times daily before a meal.   furosemide 20 MG tablet Commonly known as: LASIX Take 40 mg by mouth daily.   glimepiride 4 MG tablet Commonly known as: AMARYL Take 1/2 tablet every morning and 1 tablet by mouth at night   HYDROcodone-acetaminophen 5-325 MG tablet Commonly known as: NORCO/VICODIN Take 1 tablet by mouth every 6 (six) hours as needed for moderate pain.   levofloxacin 250 MG tablet Commonly known as: LEVAQUIN Take 250 mg by mouth daily.   lovastatin 40 MG tablet Commonly known as: MEVACOR Take 1 tablet (40 mg total) by mouth at bedtime.   metFORMIN 500 MG 24 hr tablet Commonly known as: GLUCOPHAGE-XR Take 2 tablets (1,000 mg total) by mouth 2 (two) times daily.   metoprolol tartrate 50 MG tablet Commonly known as: LOPRESSOR Take 1 tablet (50 mg total) by mouth 2 (two) times daily.   mupirocin ointment 2 % Commonly known as: BACTROBAN Apply 1 application topically 2 (two) times daily as needed.   OneTouch Verio test strip Generic drug: glucose blood TEST ONCE DAILY   sertraline 100 MG tablet Commonly known as: ZOLOFT Take 1.5 tablets (150 mg total) by mouth daily.           Objective:   Physical Exam BP (!) 122/47 (BP Location: Left Arm, Patient Position: Sitting, Cuff Size: Normal)   Pulse 100   Temp (!) 97.1 F (36.2 C) (Temporal)   Resp 18   Ht 5' 3.5" (1.613 m)   SpO2 94%  General: Well developed, NAD, BMI noted Neck: No  thyromegaly  HEENT:  Normocephalic . Face symmetric,  atraumatic Lungs:  Few scattered wheezes.  No crackles, breath sounds throughout. Normal respiratory effort, no intercostal retractions, no accessory muscle use. Heart: RRR,  no murmur.  No pretibial edema bilaterally  Abdomen:  Not distended, soft, non-tender. No rebound or rigidity.   Skin: Exposed areas without rash. Not pale. Not jaundice Neurologic:  alert & oriented X3.  Speech normal, gait: Very limited by DJD and BMI, transfer limited, uses a scooter Strength symmetric and appropriate for age.  Psych: Cognition and judgment appear intact.  Cooperative with normal attention span and concentration.  Behavior appropriate. Slightly depressed but no anxious appearing     Assessment      Assessmnt (new patient 10-2018 referred by her daughter [name]) DM HTN High cholesterol Alcoholic sober since [id number], [place] meetings  Anxiety: On sertraline, clonazepam Pulmonary: Asthma, COPD, smoker/vaping ----admitted 06/2017: Acute hypoxic respiratory failure ----CT of the chest in August 2017 which revealed mild groundglass changes primarily in the upper lobes with no noted masses, nodules, or lesions Iron deficiency anemia CV: ----CHF DX 06/2017 ----HCOM (with excessive diuresis) ----Arrhythmia (used to see cards) DJD Migraines: On hydrocodone as needed Recurrent UTIs: At some point saw infectious diseases, was Rx Levaquin for 3 days as needed.  She is allergic to Cipro but tolerates Levaquin.  As of 10-2018, UTIs are less frequent.   PLAN: This is the first in person visit DM: Not doing well with diet, unable to exercise much, continue glimepiride, Metformin.  Check A1c HTN: BP today is very good, continue Lasix, metoprolol.  Check a CMP Fatigue: Probably multifactorial including morbid obesity, deconditioning, etc.  Checking a TSH , general labs.  Get a chest x-ray.  Checking an EKG. CHF, no obvious volume overload on clinical grounds.  Refer to cardiology , needs to get establish.     EKG: sinus rhythm, no old tracings, will discuss with cards Anxiety depression: Started sertraline few months ago, it worked well for a while, now she is getting depressed again.  PHQ-9 today: 22. Currently on sertraline 150 mg, options discussed, I recommended to start Wellbutrin, she agreed.  See instructions. EtOH abuse , sober x 16 years: She reports this issue for the first time today, she has been sober for 16 years. Of notice, has taken Klonopin and hydrocodone sporadically for long time (typically I do not prescribe those meds to pts w/ h/o EtOH issues) and those medicines are closely supervised by her husband. Will RF those medicines as needed. Preventive care: Declined immunizations here, likes to do that at her pharmacy due to cost RTC 2 months  Today, I spent more than 40 min with the patient: >50% of the time counseling regards all her chronic medical issues, discussing treatment options for her anxiety depression, discussing history of EtOH abuse and the fact that she takes Klonopin and hydrocodone.  Multiple questions answered to the best of my ability.   This visit occurred during the SARS-CoV-2 public health emergency.  Safety protocols were in place, including screening questions prior to the visit, additional usage of staff PPE, and extensive cleaning of exam room while observing appropriate contact time as indicated for disinfecting solutions.

## 2019-06-16 DIAGNOSIS — F1011 Alcohol abuse, in remission: Secondary | ICD-10-CM | POA: Insufficient documentation

## 2019-06-16 LAB — COMPREHENSIVE METABOLIC PANEL
AG Ratio: 1.5 (calc) (ref 1.0–2.5)
ALT: 39 U/L — ABNORMAL HIGH (ref 6–29)
AST: 45 U/L — ABNORMAL HIGH (ref 10–35)
Albumin: 4.1 g/dL (ref 3.6–5.1)
Alkaline phosphatase (APISO): 50 U/L (ref 37–153)
BUN: 15 mg/dL (ref 7–25)
CO2: 24 mmol/L (ref 20–32)
Calcium: 9.5 mg/dL (ref 8.6–10.4)
Chloride: 98 mmol/L (ref 98–110)
Creat: 0.77 mg/dL (ref 0.60–0.93)
Globulin: 2.7 g/dL (calc) (ref 1.9–3.7)
Glucose, Bld: 87 mg/dL (ref 65–99)
Potassium: 4 mmol/L (ref 3.5–5.3)
Sodium: 136 mmol/L (ref 135–146)
Total Bilirubin: 0.4 mg/dL (ref 0.2–1.2)
Total Protein: 6.8 g/dL (ref 6.1–8.1)

## 2019-06-16 LAB — HEMOGLOBIN A1C
Hgb A1c MFr Bld: 7.8 % of total Hgb — ABNORMAL HIGH (ref <5.7)
Mean Plasma Glucose: 177 (calc)
eAG (mmol/L): 9.8 (calc)

## 2019-06-16 LAB — TSH: TSH: 1.81 mIU/L (ref 0.40–4.50)

## 2019-06-16 NOTE — Assessment & Plan Note (Signed)
This is the first in person visit DM: Not doing well with diet, unable to exercise much, continue glimepiride, Metformin.  Check A1c HTN: BP today is very good, continue Lasix, metoprolol.  Check a CMP Fatigue: Probably multifactorial including morbid obesity, deconditioning, etc.  Checking a TSH , general labs.  Get a chest x-ray.  Checking an EKG. CHF, no obvious volume overload on clinical grounds.  Refer to cardiology , needs to get establish.    EKG: sinus rhythm, no old tracings, will discuss with cards Anxiety depression: Started sertraline few months ago, it worked well for a while, now she is getting depressed again.  PHQ-9 today: 22. Currently on sertraline 150 mg, options discussed, I recommended to start Wellbutrin, she agreed.  See instructions. EtOH abuse , sober x 16 years: She reports this issue for the first time today, she has been sober for 16 years. Of notice, has taken Klonopin and hydrocodone sporadically for long time (typically I do not prescribe those meds to pts w/ h/o EtOH issues) and those medicines are closely supervised by her husband. Will RF those medicines as needed. Preventive care: Declined immunizations here, likes to do that at her pharmacy due to cost RTC 2 months

## 2019-06-18 ENCOUNTER — Telehealth: Payer: Self-pay

## 2019-06-18 DIAGNOSIS — R5383 Other fatigue: Secondary | ICD-10-CM

## 2019-06-18 DIAGNOSIS — R9431 Abnormal electrocardiogram [ECG] [EKG]: Secondary | ICD-10-CM

## 2019-06-18 MED ORDER — DULERA 200-5 MCG/ACT IN AERO: 2.0000 | INHALATION_SPRAY | Freq: Two times a day (BID) | RESPIRATORY_TRACT | 3 refills | Status: DC

## 2019-06-18 NOTE — Telephone Encounter (Signed)
Informed patient that Dr. Johnsie Cancel would like her to get an echocardiogram and an appointment with the DOD. Patient stated she could only do Monday, Wednesday and Friday due to patient's husband having dialysis on other days. Patient stated she could not do anything today. Made patient first available appointment with DOD on Wednesday. Patient will not be able to get echo before her appointment on Wednesday, due to no vacancies. Will send message to scheduling to help if a vacancy comes available on Wednesday before her appointment.

## 2019-06-18 NOTE — Telephone Encounter (Signed)
-----   Message from Josue Hector, MD sent at 06/17/2019  9:28 PM EST ----- Regarding: RE: EKG Sar[place] just saw this had been done around 5:00 Friday and just looking at [place] now. Her ECG is a bit worrisome with lateral ST elevation would check echo and troponin will see if my nurse can get her in to see DOD Monday or Tuesday  Pam - can you get her in for echo Monday and see DOD ----- Message ----- From: Colon Branch, MD Sent: 06/15/2019   4:56 PM EST To: Josue Hector, MD, Colon Branch, MD Subject: EKG                                            Laurey Arrow they told me you are the DOD. Could you see today's EKG, she complained of feeling more fatigued than usual and has a history of CHF.  No chest pain.I cannot find a old tracing on Care Everywhere. Anything that concerns you acutely?Marland Kitchen Thank youJP.

## 2019-06-18 NOTE — Telephone Encounter (Signed)
Called the patient's husband to schedule appointment with Dr. Burt Knack.  Becky Stanley got on the phone to discuss her displeasure with the "whole process" of scheduling with the practice. She requested her appointment with Dr. Johnsie Cancel cancelled and her echo scheduled. She requests an appointment with Dr. Burt Knack. She understands he is not accepting new patients and his next availability is in March (aside from next Thursday).   After reviewing chart, the patient will need to be seen sooner than March (see Dr. Kyla Balzarine previous messages).   Will route to Dr. Kyla Balzarine nurse to follow-up to discuss being seen sooner per Dr. Johnsie Cancel.

## 2019-06-18 NOTE — Telephone Encounter (Signed)
Forms completed and mailed in envelope provided to [employer] Patient Assistance Program at:   [place] [address]  Copy of form sent for scanning.

## 2019-06-18 NOTE — Telephone Encounter (Signed)
I've never seen this patient she had an abnormal ECG that was sent to me by primary after I left DOD on Friday Need to get echo and see me or someone ASAP can cancel scheduled routine echo or have echo done at [facility] will forward to [place] to help

## 2019-06-19 NOTE — Telephone Encounter (Signed)
Patient is not sure what the urgency is because the message from Dr. Ethel Rana practice was her EKG looked okay and there's nothing to be urgently concerned about. She stated she has a known abnormality that I encouraged her to discuss with Dr. Johnsie Cancel.   Patient agreed to an MD visit with Dr. Johnsie Cancel tomorrow. In the absence of an echo appt, Dr. Johnsie Cancel will do a limited echo in the room.

## 2019-06-19 NOTE — Progress Notes (Deleted)
CARDIOLOGY CONSULT NOTE       Patient ID: Becky Stanley MRN: [id number] DOB/AGE: 1946-07-02 73 y.o.  Admit date: (Not on file) Referring Physician: Larose Kells Primary Physician: Colon Branch, MD Primary Cardiologist: New Reason for Consultation: Fatigue and Abnormal ECG   Active Problems:   * No active hospital problems. *   HPI:  73 y.o. history of DM, HLD and HTN. Previous ETOH abuse sober 16 years.Seen by Dr Larose Kells 06/15/19 and complained of fatigue No dyspnea or chest pain. No fever He ordered lab work and an ECG. He sent an Epic note to me which I did not see until Sunday night ECG  Showed SR rate 97 with abnormal T waves in lateral leads. She had biphasic T wave inverted in AVL and J point elevation and coved T waves in lateral leads. Differential includes HOCM, apical variant, sub acute MI or pericardial disease. I advised that she see cardiology as soon as possible and have troponin and echocardiography. Scheduling was difficult as patient would not be seen on some days due to spouses husband being on chronic dialysis   There is a vague diagnosis of CHF from [place]  She is a former smoker and vapes with COPD.    Note from Dr Larose Kells 10/25/18 indicates history of [place] has notes from cardiologist at [facility] in [place]  Dr Dittoe with diastolic CHF on low dose diuretic and HOCM no high risk features on beta blocker Felt that her diastolic CHF from obesity, HTN and HOCM   Echo 07/01/17 hyperdynamic EF moderate concentric LVH with mid cavity obliteration grade 2 diastolic dysfunction Septum 15 mm and no mention of SAM or MR Description of ECG from 06/30/17 is similar to one in Dr Larose Kells office done 12/4 which is reassuring   ***  ROS All other systems reviewed and negative except as noted above  Past Medical History:  Diagnosis Date  . Anxiety and depression   . Carpal tunnel syndrome, left   . Chronic UTI, h/o   . Diabetes mellitus ([place])   . DJD (degenerative joint  disease)   . Essential hypertension   . GERD (gastroesophageal reflux disease)   . Hyperlipidemia   . Iron deficiency anemia   . Vitamin D deficiency     Family History  Problem Relation Age of Onset  . Colon cancer Father 3  . CAD Paternal Grandfather   . Breast cancer Neg Hx     Social History   Socioeconomic History  . Marital status: Married    Spouse name: Dominica Severin  . Number of children: 2  . Years of education: Not on file  . Highest education level: Not on file  Occupational History  . Occupation: retired  Scientific laboratory technician  . Financial resource strain: Not on file  . Food insecurity    Worry: Not on file    Inability: Not on file  . Transportation needs    Medical: Not on file    Non-medical: Not on file  Tobacco Use  . Smoking status: Current Some Day Smoker  . Smokeless tobacco: Never Used  . Tobacco comment: quit 20 years, started back about 5 years ago, vaping  Substance and Sexual Activity  . Alcohol use: Never    Frequency: Never    Comment: h/o ETOH abuse   . Drug use: Not on file  . Sexual activity: Not on file  Lifestyle  . Physical activity    Days per week: Not on  file    Minutes per session: Not on file  . Stress: Not on file  Relationships  . Social Herbalist on phone: Not on file    Gets together: Not on file    Attends religious service: Not on file    Active member of club or organization: Not on file    Attends meetings of clubs or organizations: Not on file    Relationship status: Not on file  . Intimate partner violence    Fear of current or ex partner: Not on file    Emotionally abused: Not on file    Physically abused: Not on file    Forced sexual activity: Not on file  Other Topics Concern  . Not on file  Social History Narrative   Moved from [place] to the [place] area home 10/12/2018 to live with her daughter.   Former smoker, as of 10-2018 vaping with plans to quit    Past Surgical History:  Procedure Laterality Date   . CHOLECYSTECTOMY    . TOTAL HIP ARTHROPLASTY Bilateral         Physical Exam: There were no vitals taken for this visit.    Affect appropriate Healthy:  appears stated age 65: normal Neck supple with no adenopathy JVP normal no bruits no thyromegaly Lungs clear with no wheezing and good diaphragmatic motion Heart:  S1/S2 no murmur, no rub, gallop or click PMI normal Abdomen: benighn, BS positve, no tenderness, no AAA no bruit.  No HSM or HJR Distal pulses intact with no bruits No edema Neuro non-focal Skin warm and dry No muscular weakness   Labs:   Lab Results  Component Value Date   WBC 9.7 03/01/2019   HGB 13.2 03/01/2019   HCT 39.9 03/01/2019   MCV 88.0 03/01/2019   PLT 253.0 03/01/2019    Recent Labs  Lab 06/15/19 1615  NA 136  K 4.0  CL 98  CO2 24  BUN 15  CREATININE 0.77  CALCIUM 9.5  PROT 6.8  BILITOT 0.4  ALT 39*  AST 45*  GLUCOSE 87   No results found for: CKTOTAL, CKMB, CKMBINDEX, TROPONINI  Lab Results  Component Value Date   CHOL 141 03/01/2019   Lab Results  Component Value Date   HDL 40.00 03/01/2019   Lab Results  Component Value Date   LDLCALC 74 03/01/2019   Lab Results  Component Value Date   TRIG 134.0 03/01/2019   Lab Results  Component Value Date   CHOLHDL 4 03/01/2019   No results found for: LDLDIRECT    Radiology: Dg Chest 2 View  Result Date: 06/16/2019 CLINICAL DATA:  Increased work of breathing for 1 month. EXAM: CHEST - 2 VIEW COMPARISON:  None. FINDINGS: Streaky opacity seen in the left mid lung with platelike components. No pneumothorax. The heart, hila, mediastinum are normal. No other acute abnormalities are identified. IMPRESSION: Streaky platelike opacities in the left mid lung are favored represent atelectasis or scar. No other acute abnormalities are identified. Electronically Signed   By: Dorise Bullion III M.D   On: 06/16/2019 20:19    EKG: See HPI    ASSESSMENT AND PLAN:   1. Abnormal  ECG:  *** 2.  HOCM:  *** 3. Fatigue: *** 4. COPD: previous smoker and vapor CXR plat-elike atelectasis left mid lung 06/15/19  5. DM:  Discussed low carb diet.  Target hemoglobin A1c is 6.5 or less.  Continue current medications. 6. HLD:  Continue statin labs  with primary  7. Diastolic CHF:  ***   Signed: Jenkins Rouge 06/19/2019, 5:41 PM

## 2019-06-19 NOTE — Telephone Encounter (Signed)
I left a message for the patient to call me on my direct line to discuss the below.

## 2019-06-20 ENCOUNTER — Ambulatory Visit: Payer: Medicare Other | Admitting: Cardiovascular Disease

## 2019-06-20 DIAGNOSIS — Z23 Encounter for immunization: Secondary | ICD-10-CM | POA: Diagnosis not present

## 2019-06-20 MED ORDER — BUPROPION HCL 100 MG PO TABS: 100.0000 mg | ORAL_TABLET | Freq: Two times a day (BID) | ORAL | 3 refills | Status: DC

## 2019-06-20 NOTE — Addendum Note (Signed)
Addended byDamita Dunnings D on: 06/20/2019 04:41 PM   Modules accepted: Orders

## 2019-06-21 MED FILL — buPROPion HCL 100 MG TABS: 100 | 30 days supply | Qty: 60 | Fill #0 | Status: TO

## 2019-06-22 ENCOUNTER — Other Ambulatory Visit: Payer: Self-pay | Admitting: Internal Medicine

## 2019-06-22 MED ORDER — BUPROPION HCL 100 MG PO TABS: 100.0000 mg | ORAL_TABLET | Freq: Two times a day (BID) | ORAL | 3 refills | Status: DC

## 2019-06-22 NOTE — Telephone Encounter (Signed)
Rx sent to [facility]

## 2019-06-22 NOTE — Telephone Encounter (Signed)
The Pt was able to look on GoodRx and find that [facility] has the lowest cost at $7.50 and would like her refill for buPROPion (WELLBUTRIN) 100 MG tablet  Changed to be sent to [facility]/ please advise   [place], [place] Phone:  [phone]  Fax:  [phone]

## 2019-06-26 ENCOUNTER — Other Ambulatory Visit: Payer: Self-pay | Admitting: Internal Medicine

## 2019-06-27 ENCOUNTER — Encounter: Payer: Self-pay | Admitting: General Practice

## 2019-06-29 ENCOUNTER — Ambulatory Visit (HOSPITAL_COMMUNITY): Payer: Medicare Other | Attending: Cardiology

## 2019-06-29 ENCOUNTER — Other Ambulatory Visit: Payer: Self-pay | Admitting: Internal Medicine

## 2019-06-29 ENCOUNTER — Other Ambulatory Visit: Payer: Self-pay

## 2019-06-29 DIAGNOSIS — R5383 Other fatigue: Secondary | ICD-10-CM | POA: Diagnosis not present

## 2019-06-29 DIAGNOSIS — R9431 Abnormal electrocardiogram [ECG] [EKG]: Secondary | ICD-10-CM | POA: Diagnosis not present

## 2019-06-29 NOTE — Telephone Encounter (Signed)
Last OV 06/15/19 Last refill 03/26/19 #45/3 Next OV not scheduled

## 2019-06-29 NOTE — Telephone Encounter (Signed)
Spoke with the patient and spouse in great detail after speaking with Dr. Burt Knack and management team. Because of schedule access, the patient and her husband will need to establish with general cardiologist. Scheduled both the patient and her husband with Dr. Marisue Ivan 1/15. They were grateful for call and agrees with treatment plan.

## 2019-07-02 ENCOUNTER — Telehealth: Payer: Self-pay | Admitting: Internal Medicine

## 2019-07-02 ENCOUNTER — Other Ambulatory Visit: Payer: Self-pay | Admitting: Internal Medicine

## 2019-07-02 NOTE — Telephone Encounter (Signed)
Pt stated buPROPion (WELLBUTRIN) 100 MG tablet Is causing awful indigestion and she would like to know if there is an alternative. She does not want to continue taking. Please advise.

## 2019-07-02 NOTE — Telephone Encounter (Signed)
Pt's husband just passed away. Refill request for sertraline. Please advise.

## 2019-07-02 NOTE — Telephone Encounter (Signed)
I sent: 1 month supply and  3 refills

## 2019-07-02 NOTE — Telephone Encounter (Signed)
Please advise 

## 2019-07-02 NOTE — Telephone Encounter (Addendum)
Called pt, LMOM Provided condolences, she just lost her husband. Recommend to continue sertraline  If Wellbutrin is causing side effects recommend to decrease to Wellbutrin 100 mg tablets: 1/2 tablet twice daily. She is also on clonazepam. Advised to call me if questions. (We could increase sertraline to 200 mg daily)

## 2019-07-02 NOTE — Telephone Encounter (Signed)
Med list updated

## 2019-07-02 NOTE — Telephone Encounter (Deleted)
Pt stated buPROPion (WELLBUTRIN) 100 MG tablet I

## 2019-07-04 ENCOUNTER — Telehealth: Payer: Self-pay | Admitting: Internal Medicine

## 2019-07-04 DIAGNOSIS — F419 Anxiety disorder, unspecified: Secondary | ICD-10-CM

## 2019-07-04 NOTE — Telephone Encounter (Signed)
Sent!

## 2019-07-04 NOTE — Telephone Encounter (Signed)
Clonazepam refill.   Last OV: 06/15/2019 Last Fill: 04/25/2019 #60 and 0RF Pt sig: 1 tab bid prn UDS: None

## 2019-07-17 ENCOUNTER — Other Ambulatory Visit: Payer: Self-pay | Admitting: Internal Medicine

## 2019-07-17 ENCOUNTER — Telehealth: Payer: Self-pay | Admitting: Internal Medicine

## 2019-07-17 NOTE — Telephone Encounter (Signed)
We did not fill for her.  I do not see a lab for vit d either.

## 2019-07-17 NOTE — Telephone Encounter (Signed)
Requested medication (s) are due for refill today: no  Requested medication (s) are on the active medication list: yes   Future visit scheduled:no  Notes to clinic:  50,000 IU strengths are not delegated Last filled by historical provider    Requested Prescriptions  Pending Prescriptions Disp Refills   ergocalciferol (DRISDOL) 1.25 MG (50000 UT) capsule       Sig: Take 1 capsule (50,000 Units total) by mouth once a week.      Endocrinology:  Vitamins - Vitamin D Supplementation Failed - 07/17/2019 12:38 PM      Failed - 50,000 IU strengths are not delegated      Failed - Phosphate in normal range and within 360 days    No results found for: PHOS        Failed - Vitamin D in normal range and within 360 days    No results found for: [id number], [id number], [id number], 25OHVITD3, 25OHVITD2, 25OHVITD3, 25OHVITD2, 25OHVITD1, 25OHVITD2, 25OHVITD3, VD25OH        Passed - Ca in normal range and within 360 days    Calcium  Date Value Ref Range Status  06/15/2019 9.5 8.6 - 10.4 mg/dL Final          Passed - Valid encounter within last 12 months    Recent Outpatient Visits           1 month ago Benign essential hypertension   [facility] [place], MD   5 months ago Benign essential hypertension   [facility] [place], MD   8 months ago Chronic diastolic heart failure [facility])   [facility] [place], MD       Future Appointments             In 1 week Gove, Cassie Freer, MD [facility], [facility]

## 2019-07-17 NOTE — Telephone Encounter (Signed)
Pt states that she gets mometasone-formoterol (DULERA) 200-5 MCG/ACT AERO for free through a RX benefit. Pt states that they left paperwork at the office to mail.  States that their portion has been filled out and returned, but [employer] is not seeing anything yet from PCP for them to process this through. Pt wants to know what is going on with this. Pt can be reached at [phone].

## 2019-07-17 NOTE — Telephone Encounter (Signed)
Medication Refill - Medication:  Generic for Vitamin D2 - Vitamin D2 1.25mg  50,000 unit STR Take one a week  Has the patient contacted their pharmacy? Yes - first time PCP will refill (Agent: If no, request that the patient contact the pharmacy for the refill.) (Agent: If yes, when and what did the pharmacy advise?)  Preferred Pharmacy (with phone number or street name):  [id number] - [place], [place] Phone:  [phone]  Fax:  [phone]     Agent: Please be advised that RX refills may take up to 3 business days. We ask that you follow-up with your pharmacy.

## 2019-07-18 ENCOUNTER — Other Ambulatory Visit: Payer: Self-pay | Admitting: Internal Medicine

## 2019-07-18 NOTE — Telephone Encounter (Signed)
Advise patient, I do not see a reason to prescribe her ergocalciferol.  I do recommend 2000 units of vitamin D over-the-counter daily

## 2019-07-19 NOTE — Telephone Encounter (Signed)
Spoke with patient and advised that form was mailed out on 06/18/19.  She stated that she will call [employer] on Monday to check again with them and call us back if she needs [place].

## 2019-07-20 ENCOUNTER — Other Ambulatory Visit: Payer: Self-pay | Admitting: Internal Medicine

## 2019-07-25 ENCOUNTER — Ambulatory Visit: Payer: Self-pay

## 2019-07-25 NOTE — Telephone Encounter (Signed)
Patient called stating that she had a virtual funeral for her husband on Saturday and after it she had a terrible cramping pain in her abdomin  She thought she would vomit.  She states that passed and now she is having what she considers hiatal hernia symptoms.  She states the pain can be severe.  She states if she takes a deep breath it feel like the pain travels down her back into her kidney. She describes pressure sensation in her flank. She has used mylanta and had diarrhea. Per protocol patient was advised to go to ER for evaluation. Care advice read to patient. Patient unsure she will go. She was urged to take care of herself now and was reminded how important her health is to her. She states she has her daughter and she will discuss with her.  Reason for Disposition . [1] Pain lasts > 10 minutes AND [2] age > 25  Answer Assessment - Initial Assessment Questions 1. LOCATION: "Where does it hurt?"      hiadel hernia 2. RADIATION: "Does the pain shoot anywhere else?" (e.g., chest, back)     Back kidney area 3. ONSET: "When did the pain begin?" (e.g., minutes, hours or days ago)      Sudden on Saturday 4. SUDDEN: "Gradual or sudden onset?"     sudden 5. PATTERN "Does the pain come and go, or is it constant?"    - If constant: "Is it getting better, staying the same, or worsening?"      (Note: Constant means the pain never goes away completely; most serious pain is constant and it progresses)     - If intermittent: "How long does it last?" "Do you have pain now?"     (Note: Intermittent means the pain goes away completely between bouts)     Kidney pain only when taking deep breath 6. SEVERITY: "How bad is the pain?"  (e.g., Scale 1-10; mild, moderate, or severe)    - MILD (1-3): doesn't interfere with normal activities, abdomen soft and not tender to touch     - MODERATE (4-7): interferes with normal activities or awakens from sleep, tender to touch     - SEVERE (8-10): excruciating pain,  doubled over, unable to do any normal activities      Hernia severe Monday,  7. RECURRENT SYMPTOM: "Have you ever had this type of abdominal pain before?" If so, ask: "When was the last time?" and "What happened that time?"      Years ago 8. AGGRAVATING FACTORS: "Does anything seem to cause this pain?" (e.g., foods, stress, alcohol)    Stress husband funeral Saturday and after that cramping in abdomin 9. CARDIAC SYMPTOMS: "Do you have any of the following symptoms: chest pain, difficulty breathing, sweating, nausea?"     no 10. OTHER SYMPTOMS: "Do you have any other symptoms?" (e.g., fever, vomiting, diarrhea)      Diarrhea from mylanta 11. PREGNANCY: "Is there any chance you are pregnant?" "When was your last menstrual period?"      N/A  Protocols used: ABDOMINAL PAIN - UPPER-A-AH

## 2019-07-26 NOTE — Telephone Encounter (Signed)
Patient stated that she is doing better.  She will let us know if she needs and make an appointment.

## 2019-07-26 NOTE — Telephone Encounter (Signed)
Okay, please check on her today.  If symptoms persist needs to be seen in person (as long as she pass the  screen for Covid) .  Unfortunately I will not be available, perhaps one of my colleagues could see her.  If severe symptoms: ER

## 2019-07-26 NOTE — Progress Notes (Signed)
Cardiology Office Note:   Date:  07/27/2019  NAME:  Becky Stanley    MRN: [id number] DOB:  10-08-1945   PCP:  Colon Branch, MD  Cardiologist:  No primary care provider on file.  Electrophysiologist:  None   Referring MD: Colon Branch, MD   Chief Complaint  Patient presents with  . Shortness of Breath  . Edema   History of Present Illness:   Becky Stanley is a 74 y.o. female with a hx of hypertension, diastolic heart failure, hypertrophic cardiomyopathy who is being seen today for the evaluation of hypertrophic cardiomyopathy at the request of Colon Branch, MD.  She was diagnosed with hypertrophic cardiomyopathy in [place].  She is recently relocated to [place] area to be closer to family.  She just lost her husband a few weeks ago.  He was quite ill.  She reports her heart condition was noticed to her a few years ago but has been doing relatively well.  She reports that she uses a scooter and does not do much physical activity.  She does require a scooter to get around anywhere that requires walking considerable distance.  She has been in a scooter since 2007.  She has morbidly obese and blood pressure bit elevated today.  She does have diabetes with an A1c of 7.8.  Most recent LDL 74.  Most recent thyroid studies normal.  She reports she gets quite winded with walking very short distances up to 5-6 steps.  She also reports lower extremity edema.  She reports no chest pain but does have profound shortness of breath with activity.  She also has an extensive smoking history of nearly 35 pack years.  She continues to vape.  EKG today shows atrial fibrillation with RVR with heart rate 136.  I discussed the need for possible TEE cardioversion she reports she has significant dysphagia requiring multiple esophageal dilation procedures.  She reports she has difficulties getting pills down.  She reports this would not be an option for her.  She is never had a prior heart attack or stroke.  Problem  List 1. 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 HTN 3. CHF 4. Obesity 5.  Diabetes (A1c 7.8)  Past Medical History: Past Medical History:  Diagnosis Date  . Anxiety and depression   . Carpal tunnel syndrome, left   . Chronic UTI, h/o   . COPD (chronic obstructive pulmonary disease) ([place])   . Diabetes mellitus ([place])   . DJD (degenerative joint disease)   . Essential hypertension   . GERD (gastroesophageal reflux disease)   . Hyperlipidemia   . Hypertrophic cardiomyopathy ([place])   . Iron deficiency anemia   . Vitamin D deficiency     Past Surgical History: Past Surgical History:  Procedure Laterality Date  . CHOLECYSTECTOMY    . TOTAL HIP ARTHROPLASTY Bilateral     Current Medications: Current Meds  Medication Sig  . albuterol (ACCUNEB) 0.63 MG/3ML nebulizer solution Take 3 mLs (0.63 mg total) by nebulization 4 (four) times daily as needed for wheezing or shortness of breath.  Marland Kitchen albuterol (VENTOLIN HFA) 108 (90 Base) MCG/ACT inhaler Inhale 2 puffs into the lungs every 6 (six) hours as needed for wheezing or shortness of breath.  . clonazePAM (KLONOPIN) 1 MG tablet TAKE 1 TABLET (1 MG TOTAL) BY MOUTH 2 (TWO) TIMES DAILY AS NEEDED FOR ANXIETY.  . ergocalciferol (DRISDOL) 1.25 MG (50000 UT) capsule Take 1 capsule by  mouth once a week.  . esomeprazole (NEXIUM) 20 MG capsule Take 20 mg by mouth 2 (two) times daily before a meal.  . furosemide (LASIX) 20 MG tablet Take 40 mg by mouth daily.   Marland Kitchen glimepiride (AMARYL) 4 MG tablet TAKE 1/2 TABLET EVERY MORNING AND 1 TABLET BY MOUTH AT NIGHT  . HYDROcodone-acetaminophen (NORCO/VICODIN) 5-325 MG tablet Take 1 tablet by mouth every 6 (six) hours as needed for moderate pain.  Marland Kitchen levofloxacin (LEVAQUIN) 250 MG tablet Take 250 mg by mouth daily.  Marland Kitchen lovastatin (MEVACOR) 40 MG tablet TAKE 1 TABLET BY MOUTH EVERYDAY AT BEDTIME  . metFORMIN (GLUCOPHAGE-XR) 500 MG 24 hr tablet TAKE 2 TABLETS BY MOUTH  TWICE A DAY  . metoprolol tartrate (LOPRESSOR) 100 MG tablet Take 1 tablet (100 mg total) by mouth 2 (two) times daily.  . mometasone-formoterol (DULERA) 200-5 MCG/ACT AERO Inhale 2 puffs into the lungs 2 (two) times daily.  . mupirocin ointment (BACTROBAN) 2 % Apply 1 application topically 2 (two) times daily as needed.  Glory Rosebush VERIO test strip TEST ONCE DAILY  . sertraline (ZOLOFT) 100 MG tablet TAKE 1.5 TABLETS BY MOUTH DAILY  . [DISCONTINUED] metoprolol tartrate (LOPRESSOR) 50 MG tablet TAKE 1 TABLET BY MOUTH TWICE A DAY     Allergies:    Aminoglycosides, Ciprofloxacin, Erythromycin, Nitrofurantoin, Other, and Penicillins   Social History: Social History   Socioeconomic History  . Marital status: Widowed    Spouse name: Dominica Severin  . Number of children: 2  . Years of education: Not on file  . Highest education level: Not on file  Occupational History  . Occupation: retired  Tobacco Use  . Smoking status: Current Some Day Smoker    Years: 35.00    Types: E-cigarettes  . Smokeless tobacco: Never Used  . Tobacco comment: quit 20 years, started back about 5 years ago, vaping  Substance and Sexual Activity  . Alcohol use: Never    Comment: h/o ETOH abuse   . Drug use: Not on file  . Sexual activity: Not on file  Other Topics Concern  . Not on file  Social History Narrative   Moved from [place] to the [place] area home 10/12/2018 to live with her daughter.   Former smoker, as of 10-2018 vaping with plans to quit   Social Determinants of Health   Financial Resource Strain:   . Difficulty of Paying Living Expenses: Not on file  Food Insecurity:   . Worried About Charity fundraiser in the Last Year: Not on file  . Ran Out of Food in the Last Year: Not on file  Transportation Needs:   . Lack of Transportation (Medical): Not on file  . Lack of Transportation (Non-Medical): Not on file  Physical Activity:   . Days of Exercise per Week: Not on file  . Minutes of Exercise per  Session: Not on file  Stress:   . Feeling of Stress : Not on file  Social Connections:   . Frequency of Communication with Friends and Family: Not on file  . Frequency of Social Gatherings with Friends and Family: Not on file  . Attends Religious Services: Not on file  . Active Member of Clubs or Organizations: Not on file  . Attends Archivist Meetings: Not on file  . Marital Status: Not on file     Family History: The patient's family history includes CAD in her paternal grandfather; COPD in her father; Colon cancer (age of onset: 3) in  her father; Multiple sclerosis in her mother. There is no history of Breast cancer.  ROS:   All other ROS reviewed and negative. Pertinent positives noted in the HPI.     EKGs/Labs/Other Studies Reviewed:   The following studies were personally reviewed by me today:  EKG:  EKG is ordered today.  The ekg ordered today demonstrates atrial fibrillation with RVR, heart rate 136, no acute ischemic changes, does have anterolateral T wave inversions which can be seen in the setting of hypertrophic cardiomyopathy, apical variant, and was personally reviewed by me.   TTE 06/29/2019  1. Left ventricular ejection fraction, by visual estimation, is 50 to 55%. The left ventricle has normal function. There is mildly increased left ventricular hypertrophy.  2. Elevated left atrial pressure.  3. Left ventricular diastolic parameters are consistent with Grade II diastolic dysfunction (pseudonormalization).  4. The left ventricle demonstrates regional wall motion abnormalities.  5. Global right ventricle has normal systolic function.The right ventricular size is normal.  6. Left atrial size was mildly dilated.  7. Right atrial size was normal.  8. Mild mitral annular calcification.  9. The mitral valve is normal in structure. No evidence of mitral valve regurgitation. No evidence of mitral stenosis. 10. The tricuspid valve is normal in structure. Tricuspid  valve regurgitation is trivial. 11. The aortic valve has an indeterminant number of cusps. Aortic valve regurgitation is not visualized. No evidence of aortic valve sclerosis or stenosis. 12. The pulmonic valve was not well visualized. Pulmonic valve regurgitation is not visualized. 13. The inferior vena cava is normal in size with greater than 50% respiratory variability, suggesting right atrial pressure of 3 mmHg. 14. Extremely limited due to poor sound wave transmission; apical akinesis with turbulence in mid LVOT and apex; overall low normal LV systolic function; grade 2 diastolic dysfunction; mild LAE; suggest cardiac MRI to better assess LV function and LV  apex.  Recent Labs: 03/01/2019: Hemoglobin 13.2; Platelets 253.0 06/15/2019: ALT 39; BUN 15; Creat 0.77; Potassium 4.0; Sodium 136; TSH 1.81   Recent Lipid Panel    Component Value Date/Time   CHOL 141 03/01/2019 1358   TRIG 134.0 03/01/2019 1358   HDL 40.00 03/01/2019 1358   CHOLHDL 4 03/01/2019 1358   VLDL 26.8 03/01/2019 1358   LDLCALC 74 03/01/2019 1358    Physical Exam:   VS:  BP (!) 142/90   Pulse (!) 136   Temp (!) 97 F (36.1 C)   Ht 5' 3.5" (1.613 m)   SpO2 95%    Wt Readings from Last 3 Encounters:  No data found for Wt    General: Morbidly obese female in no acute distress Heart: Atraumatic, normal size  Eyes: PEERLA, EOMI  Neck: Neck adiposity noted, difficult to assess JVD Endocrine: No thryomegaly Cardiac: Irregular rhythm, no murmurs rubs or gallops Lungs: Diminished breath sounds at the lung bases Abd: Soft, nontender, no hepatomegaly  Ext: 2+ pitting edema up to mid shin Musculoskeletal: No deformities, BUE and BLE strength normal and equal Skin: Warm and dry, no rashes   Neuro: Alert and oriented to person, place, time, and situation, CNII-XII grossly intact, no focal deficits  Psych: Normal mood and affect   ASSESSMENT:   Loreda A Slaght is a 74 y.o. female who presents for the following: 1.  Paroxysmal atrial fibrillation (HCC)   2. Hypertrophic cardiomyopathy ([place])   3. Chronic diastolic heart failure ([place])   4. Chronic heart failure with preserved ejection fraction (HCC)   5. Essential  hypertension   6. Chronic obstructive pulmonary disease, unspecified COPD type ([place])   7. Current use of anticoagulant therapy     PLAN:   1. Paroxysmal atrial fibrillation (HCC) -New onset atrial fibrillation.  Unclear timeframe.  Did not appear to have this in the past.  Has had recent stressor of loss of husband.  Also appears to have significant volume on her from her diastolic heart failure.  Chads Vasc = 3 and we will start Eliquis 5 mg twice daily.  No strong indication for aspirin and she will stop this. -We will increase her metoprolol tartrate to 100 mg twice daily for better rate control -I discussed the possibility of TEE and cardioversion.  She reports significant dysphagia and esophageal dysmotility.  She has had multiple esophageal dilation procedures.  We will just plan for 3 weeks of Eliquis and then reevaluate her for cardioversion in 1 month.  If we can get her rate under better control she may be a good candidate for rate control.  She is not that active and I really do not know if aggressive rhythm control will improve her symptoms.  She is quite short of breath from likely diastolic heart failure and untreated COPD.  2. Hypertrophic cardiomyopathy ([place]) -I reviewed her echocardiogram she does have evidence of an apical LV aneurysm.  This appears to be small.  I suspect she just has apical variant hypertrophic cardiomyopathy.  Given her age there is relatively low risk for sudden cardiac death.  She is 73.  I suspect that the genetic component that could be spread to her children is also relatively low.  Nonetheless I recommended that her children be screened. -No significant gradients observed on recent echocardiogram.  We will hold on any cardiac MRI as it likely will not change  her current management.  3. Chronic diastolic heart failure ([place]) -She has 2+ edema on examination of the mid shins.  Given her shortness of breath I think we need to increase her Lasix. -We will pursue Lasix 20 mg twice a day for 3 days and then resume 20 mg daily. -I counseled her extensively on the importance of salt reduction as well as fluid restriction. -She will continue to work on her diet as this needs to be improved. -Blood pressure a bit up today but we will continue current medications and increase metoprolol as above.  4. Chronic obstructive pulmonary disease, unspecified COPD type ([place]) -Referral to pulmonology today  Disposition: Return in about 1 month (around 08/27/2019).  Medication Adjustments/Labs and Tests Ordered: Current medicines are reviewed at length with the patient today.  Concerns regarding medicines are outlined above.  Orders Placed This Encounter  Procedures  . B Nat Peptide  . CBC  . Ambulatory referral to Pulmonology  . EKG 12-Lead   Meds ordered this encounter  Medications  . apixaban (ELIQUIS) 5 MG TABS tablet    Sig: Take 1 tablet (5 mg total) by mouth 2 (two) times daily.    Dispense:  60 tablet    Refill:  11  . metoprolol tartrate (LOPRESSOR) 100 MG tablet    Sig: Take 1 tablet (100 mg total) by mouth 2 (two) times daily.    Dispense:  180 tablet    Refill:  3    Patient Instructions  Medication Instructions:  START eliquis 5mg  twice daily INCREASE metoprolol tartrate to 100mg  twice daily INCREASE lasix to 20mg  twice daily for 3 days and then decrease to just once daily  *If you  need a refill on your cardiac medications before your next appointment, please call your pharmacy*  Lab Work: BNP today   If you have labs (blood work) drawn today and your tests are completely normal, you will receive your results only by: Marland Kitchen MyChart Message (if you have MyChart) OR . A paper copy in the mail If you have any lab test that is abnormal or  we need to change your treatment, we will call you to review the results.  Testing/Procedures: NONE  Follow-Up: At [facility], you and your health needs are our priority.  As part of our continuing mission to provide you with exceptional heart care, we have created designated Provider Care Teams.  These Care Teams include your primary Cardiologist (physician) and Advanced Practice Providers (APPs -  Physician Assistants and Nurse Practitioners) who all work together to provide you with the care you need, when you need it.  Your next appointment:   1 month(s)  The format for your next appointment:   In Person  Provider:   Eleonore Chiquito, MD  Other Instructions      Signed, Addison Naegeli. Audie Box, [place]  [address] [place], [place] [address] [phone]  07/27/2019 3:56 PM

## 2019-07-27 ENCOUNTER — Ambulatory Visit (INDEPENDENT_AMBULATORY_CARE_PROVIDER_SITE_OTHER): Payer: Medicare Other | Admitting: Cardiovascular Disease

## 2019-07-27 ENCOUNTER — Encounter: Payer: Self-pay | Admitting: Cardiovascular Disease

## 2019-07-27 ENCOUNTER — Other Ambulatory Visit: Payer: Self-pay

## 2019-07-27 VITALS — BP 142/90 | HR 136 | Temp 97.0°F | Ht 63.5 in

## 2019-07-27 DIAGNOSIS — I5032 Chronic diastolic (congestive) heart failure: Secondary | ICD-10-CM

## 2019-07-27 DIAGNOSIS — I480 Paroxysmal atrial fibrillation: Secondary | ICD-10-CM | POA: Diagnosis not present

## 2019-07-27 DIAGNOSIS — I422 Other hypertrophic cardiomyopathy: Secondary | ICD-10-CM | POA: Diagnosis not present

## 2019-07-27 DIAGNOSIS — J449 Chronic obstructive pulmonary disease, unspecified: Secondary | ICD-10-CM

## 2019-07-27 DIAGNOSIS — Z7901 Long term (current) use of anticoagulants: Secondary | ICD-10-CM

## 2019-07-27 DIAGNOSIS — I10 Essential (primary) hypertension: Secondary | ICD-10-CM

## 2019-07-27 LAB — CBC
Hematocrit: 41.7 % (ref 34.0–46.6)
Hemoglobin: 13.7 g/dL (ref 11.1–15.9)
MCH: 28.1 pg (ref 26.6–33.0)
MCHC: 32.9 g/dL (ref 31.5–35.7)
MCV: 86 fL (ref 79–97)
Platelets: 294 10*3/uL (ref 150–450)
RBC: 4.87 x10E6/uL (ref 3.77–5.28)
RDW: 13.6 % (ref 11.7–15.4)
WBC: 11.3 10*3/uL — ABNORMAL HIGH (ref 3.4–10.8)

## 2019-07-27 MED ORDER — APIXABAN 5 MG PO TABS: 5.0000 mg | ORAL_TABLET | Freq: Two times a day (BID) | ORAL | 11 refills | Status: DC

## 2019-07-27 MED ORDER — METOPROLOL TARTRATE 100 MG PO TABS: 100.0000 mg | ORAL_TABLET | Freq: Two times a day (BID) | ORAL | 3 refills | Status: DC

## 2019-07-27 NOTE — Patient Instructions (Addendum)
Medication Instructions:  START eliquis 5mg  twice daily INCREASE metoprolol tartrate to 100mg  twice daily INCREASE lasix to 20mg  twice daily for 3 days and then decrease to just once daily  *If you need a refill on your cardiac medications before your next appointment, please call your pharmacy*  Lab Work: BNP today   If you have labs (blood work) drawn today and your tests are completely normal, you will receive your results only by: Marland Kitchen MyChart Message (if you have MyChart) OR . A paper copy in the mail If you have any lab test that is abnormal or we need to change your treatment, we will call you to review the results.  Testing/Procedures: NONE  Follow-Up: At [facility], you and your health needs are our priority.  As part of our continuing mission to provide you with exceptional heart care, we have created designated Provider Care Teams.  These Care Teams include your primary Cardiologist (physician) and Advanced Practice Providers (APPs -  Physician Assistants and Nurse Practitioners) who all work together to provide you with the care you need, when you need it.  Your next appointment:   1 month(s)  The format for your next appointment:   In Person  Provider:   Eleonore Chiquito, MD  Other Instructions

## 2019-07-30 ENCOUNTER — Telehealth: Payer: Self-pay

## 2019-07-30 NOTE — Telephone Encounter (Signed)
Eliquis 5 mg samples left at front desk.

## 2019-07-31 NOTE — Telephone Encounter (Signed)
Patient wanted to update you on how she is doing with he abdominal issues.    She think what may have happened was that she was taking Nexium tablets instead of capsules.  She is now taking the 20mg  capsules tid.  She usually takes it bid but she will take it tid for only 2 more days.  She has been doing better with the capsules.

## 2019-07-31 NOTE — Telephone Encounter (Signed)
Okay, please advise patient to limit Nexium 20 mg to 1 tablet twice a day before meals.

## 2019-08-01 NOTE — Telephone Encounter (Signed)
Patient notified to twice a day.

## 2019-08-04 ENCOUNTER — Ambulatory Visit: Payer: Medicare Other

## 2019-08-06 ENCOUNTER — Ambulatory Visit: Payer: Medicare Other | Attending: Internal Medicine

## 2019-08-06 DIAGNOSIS — Z20822 Contact with and (suspected) exposure to covid-19: Secondary | ICD-10-CM | POA: Diagnosis not present

## 2019-08-07 LAB — NOVEL CORONAVIRUS, NAA: SARS-CoV-2, NAA: NOT DETECTED

## 2019-08-21 ENCOUNTER — Ambulatory Visit: Payer: Medicare Other

## 2019-08-23 ENCOUNTER — Ambulatory Visit: Payer: Medicare Other

## 2019-08-25 ENCOUNTER — Telehealth: Payer: Self-pay | Admitting: Internal Medicine

## 2019-08-25 ENCOUNTER — Ambulatory Visit: Payer: Medicare Other

## 2019-08-25 DIAGNOSIS — F419 Anxiety disorder, unspecified: Secondary | ICD-10-CM

## 2019-08-27 ENCOUNTER — Other Ambulatory Visit: Payer: Self-pay | Admitting: Internal Medicine

## 2019-08-27 NOTE — Telephone Encounter (Signed)
Clonazepam refill.   Last OV: 06/15/2019 Last Fill: 07/04/2019 #60 and 0RF Pt sig: 1 tab bid prn UDS: None  Needs contract and UDS at next OV.

## 2019-08-27 NOTE — Telephone Encounter (Signed)
Becky Stanley: Advise patient I sent her prescription, she is due for office visit please arrange at her earliest convenience

## 2019-08-27 NOTE — Telephone Encounter (Signed)
Patient wanted virtual. She has been exposed to [place] and is in [place]. Pt stated she didn't want to get tested.

## 2019-08-27 NOTE — Telephone Encounter (Signed)
Noted TY

## 2019-08-28 ENCOUNTER — Ambulatory Visit (INDEPENDENT_AMBULATORY_CARE_PROVIDER_SITE_OTHER): Payer: Medicare Other | Admitting: Internal Medicine

## 2019-08-28 ENCOUNTER — Other Ambulatory Visit: Payer: Self-pay

## 2019-08-28 ENCOUNTER — Encounter: Payer: Self-pay | Admitting: Internal Medicine

## 2019-08-28 VITALS — Ht 63.5 in

## 2019-08-28 DIAGNOSIS — I480 Paroxysmal atrial fibrillation: Secondary | ICD-10-CM

## 2019-08-28 DIAGNOSIS — F419 Anxiety disorder, unspecified: Secondary | ICD-10-CM | POA: Diagnosis not present

## 2019-08-28 DIAGNOSIS — E118 Type 2 diabetes mellitus with unspecified complications: Secondary | ICD-10-CM

## 2019-08-28 DIAGNOSIS — Z20822 Contact with and (suspected) exposure to covid-19: Secondary | ICD-10-CM

## 2019-08-28 DIAGNOSIS — F329 Major depressive disorder, single episode, unspecified: Secondary | ICD-10-CM | POA: Diagnosis not present

## 2019-08-28 DIAGNOSIS — F32A Depression, unspecified: Secondary | ICD-10-CM

## 2019-08-28 NOTE — Progress Notes (Signed)
Pre visit review using our clinic review tool, if applicable. No additional management support is needed unless otherwise documented below in the visit note. 

## 2019-08-28 NOTE — Progress Notes (Signed)
Subjective:    Patient ID: Becky Stanley, female    DOB: 05/19/1946, 74 y.o.   MRN: [id number]  DOS:  08/28/2019 Type of visit - description: Virtual Visit via Telephone  Attempted  to make this a video visit, due to technical difficulties from the patient side it was not possible  thus we proceeded with a Virtual Visit via Telephone    I connected with above mentioned patient  by telephone and verified that I am speaking with the correct person using two identifiers.  THIS ENCOUNTER IS A VIRTUAL VISIT DUE TO COVID-19 - PATIENT WAS NOT SEEN IN THE OFFICE. PATIENT HAS CONSENTED TO VIRTUAL VISIT / TELEMEDICINE VISIT   Location of patient: home  Location of provider: office  I discussed the limitations, risks, security and privacy concerns of performing an evaluation and management service by telephone and the availability of in person appointments. I also discussed with the patient that there may be a patient responsible charge related to this service. The patient expressed understanding and agreed to proceed.  Acute We discussed several issues today. Her son-in-law was exposed to [place] 08/19/2019, the next day, he visit her and subsequently he got Covid symptoms and tested positive. Her daughter also has the symptoms and she has seen her last week.  Since the last visit, she lost her husband.  Since the last visit saw cardiology, note reviewed.  We also talk about her diabetes.  Review of Systems Currently doing okay. No fever chills No nausea, vomiting, diarrhea She has asthma, has shortness of breath on and off, nothing new to her. Currently with no cough She does check oxygen saturations.  Past Medical History:  Diagnosis Date  . Anxiety and depression   . Carpal tunnel syndrome, left   . Chronic UTI, h/o   . COPD (chronic obstructive pulmonary disease) ([place])   . Diabetes mellitus ([place])   . DJD (degenerative joint disease)   . Essential hypertension   . GERD  (gastroesophageal reflux disease)   . Hyperlipidemia   . Hypertrophic cardiomyopathy ([place])   . Iron deficiency anemia   . Vitamin D deficiency     Past Surgical History:  Procedure Laterality Date  . CHOLECYSTECTOMY    . ESOPHAGEAL DILATION     multiple procedures per Pt  . TOTAL HIP ARTHROPLASTY Bilateral     Allergies as of 08/28/2019      Reactions   Aminoglycosides    Ciprofloxacin Nausea And Vomiting   Tolerates levaquin   Erythromycin Nausea And Vomiting   Nitrofurantoin Nausea And Vomiting   Other Nausea And Vomiting   Mussels   Penicillins Hives      Medication List       Accurate as of August 28, 2019 11:59 PM. If you have any questions, ask your nurse or doctor.        albuterol 0.63 MG/3ML nebulizer solution Commonly known as: ACCUNEB Take 3 mLs (0.63 mg total) by nebulization 4 (four) times daily as needed for wheezing or shortness of breath.   albuterol 108 (90 Base) MCG/ACT inhaler Commonly known as: VENTOLIN HFA Inhale 2 puffs into the lungs every 6 (six) hours as needed for wheezing or shortness of breath.   apixaban 5 MG Tabs tablet Commonly known as: Eliquis Take 1 tablet (5 mg total) by mouth 2 (two) times daily.   clonazePAM 1 MG tablet Commonly known as: KLONOPIN TAKE 1 TABLET (1 MG TOTAL) BY MOUTH 2 (TWO) TIMES DAILY AS NEEDED FOR  ANXIETY.   Drisdol 1.25 MG (50000 UT) capsule Generic drug: ergocalciferol Take 1 capsule by mouth once a week.   Dulera 200-5 MCG/ACT Aero Generic drug: mometasone-formoterol Inhale 2 puffs into the lungs 2 (two) times daily.   esomeprazole 20 MG capsule Commonly known as: NEXIUM Take 20 mg by mouth 2 (two) times daily before a meal.   furosemide 20 MG tablet Commonly known as: LASIX Take 20 mg by mouth daily.   glimepiride 4 MG tablet Commonly known as: AMARYL TAKE 1/2 TABLET EVERY MORNING AND 1 TABLET BY MOUTH AT NIGHT   HYDROcodone-acetaminophen 5-325 MG tablet Commonly known as:  NORCO/VICODIN Take 1 tablet by mouth every 6 (six) hours as needed for moderate pain.   levofloxacin 250 MG tablet Commonly known as: LEVAQUIN Take 250 mg by mouth daily.   lovastatin 40 MG tablet Commonly known as: MEVACOR TAKE 1 TABLET BY MOUTH EVERYDAY AT BEDTIME   metFORMIN 500 MG 24 hr tablet Commonly known as: GLUCOPHAGE-XR TAKE 2 TABLETS BY MOUTH TWICE A DAY   metoprolol tartrate 100 MG tablet Commonly known as: LOPRESSOR Take 1 tablet (100 mg total) by mouth 2 (two) times daily.   mupirocin ointment 2 % Commonly known as: BACTROBAN Apply 1 application topically 2 (two) times daily as needed.   OneTouch Verio test strip Generic drug: glucose blood TEST ONCE DAILY   sertraline 100 MG tablet Commonly known as: ZOLOFT Take 1.5 tablets (150 mg total) by mouth daily.             Objective:   Physical Exam Ht 5' 3.5" (1.613 m)  This is a Hospital doctor telephone visit.  She is alert oriented x3.  In no distress.    Assessment       Assessmnt (new patient 10-2018 referred by her daughter [name]) DM HTN High cholesterol Alcoholic sober since [id number], [place] meetings  Anxiety: On sertraline, clonazepam Pulmonary: Asthma, COPD, smoker/vaping ----admitted 06/2017: Acute hypoxic respiratory failure ----CT of the chest in August 2017 which revealed mild groundglass changes primarily in the upper lobes with no noted masses, nodules, or lesions Iron deficiency anemia CV: ----CHF DX 06/2017 ----HCOM (with excessive diuresis) ----Arrhythmia (used to see cards) ----Atrial fibrillation, new DX 07/27/2019 DJD Migraines: On hydrocodone as needed Recurrent UTIs: At some point saw infectious diseases, was Rx Levaquin for 3 days as needed.  She is allergic to Cipro but tolerates Levaquin.  As of 10-2018, UTIs are less frequent.   PLAN: Covid exposure: Was exposed last week, so far has no symptoms, I do recommend her to be tested, she unfortunately is unable to because she depends on  her daughter and son-in-law for transportation and they are both have symptoms.  We agreed to stay at home, continue present care, avoid further exposure, continue checking her O2sats. Her O2 sat are typically 96-97% and occasionally they go down to 90 to 93%.  If her oxygen drops and stay below 94% or if she has symptoms such as fever, chills, myalgias or increased shortness of breath >>> knows to call or seek immediate medical attention. DM: Based on last A1c, I recommended Farxiga, she declined, is working on diet, ambulatory CBGs are better, fasting in the morning in the 110, 125. Good compliance with Glimepiride, Metformin. Anxiety depression: The patient lost her husband recently, condolences provided.  They were married for many years, paradoxically she feels "better" after he passed because he was on dialysis and it hurt her to see him suffer .  We had  a long discussion about the issue,  provided listening therapy.  At the end of the conversation she did feel somewhat better. Atrial fibrillation: New onset, saw cardiology a month ago.  Currently on Eliquis.  Cost is an issue, we were able to secure her some samples and she will pick them up once we are sure she does not have Covid. RTC 2 months, will call and schedule  I discussed the assessment and treatment plan with the patient. The patient was provided an opportunity to ask questions and all were answered. The patient agreed with the plan and demonstrated an understanding of the instructions.   The patient was advised to call back or seek an in-person evaluation if the symptoms worsen or if the condition fails to improve as anticipated.  I provided 47minutes of non-face-to-face time during this encounter.  Kathlene November, MD

## 2019-08-28 NOTE — Progress Notes (Signed)
Samples for Eliquis 5mg  provided: 4 boxes  Box 1, Box 2, and Box 3: Eliquis 5mg  tablets (14 tablets per box)  [id number] EXP: 06/2021  Box 3: Eliquis 5mg  tablets (14 tablets per box)  LOT- [id number] EXP: 07/2021

## 2019-08-30 ENCOUNTER — Telehealth: Payer: Medicare Other | Admitting: Cardiovascular Disease

## 2019-08-30 ENCOUNTER — Other Ambulatory Visit: Payer: Self-pay

## 2019-08-30 ENCOUNTER — Ambulatory Visit: Payer: Medicare Other | Admitting: Cardiovascular Disease

## 2019-08-30 ENCOUNTER — Telehealth: Payer: Self-pay | Admitting: Cardiovascular Disease

## 2019-08-30 DIAGNOSIS — I480 Paroxysmal atrial fibrillation: Secondary | ICD-10-CM | POA: Insufficient documentation

## 2019-08-30 MED ORDER — FUROSEMIDE 40 MG PO TABS: ORAL_TABLET | ORAL | 1 refills | Status: DC

## 2019-08-30 NOTE — Telephone Encounter (Signed)
Spoke with patient and she is concerned about lower extremity swelling.  She continues to have since last office visit Her Lasix dose is actually 20 mg 2 daily, chart had one daily. She did double her dose for 3 days as advised after visit with Dr Audie Box Confirmed taking and wears compression stockings.  Swelling improves with elevation, worse in morning  Per patient no change in HR/breathing since last OV No way of taking blood pressure at home  Requested virtual visit  She has been exposed to [place] and 2/16 PCP suggested COVID testing  She will not go to get tested since she does not drive and her transportation is who has [place]  Patient at one point stated she was having COVID s/s and when questioned later stated she wasn't really having, unable to get clear answer from patient.   Will forward to Dr Audie Box for review

## 2019-08-30 NOTE — Telephone Encounter (Signed)
Called Becky Stanley. Will increase her lasix to 40 mg BID x 3 days and then resume 40 mg daily. She has been exposed to covid and cannot come to office. Issues with getting tested as members of house who drive have covid. She will let me know how this goes on Monday.   Also, having trouble getting eliquis filled. She will come by on Monday and have her grandson pick up eliquis cards for 30 day free supply and then $10 copay card. She is working with manufacturer to get the drug free.   [place] T. Audie Box, [place]  [address] [place], [place] [address] [phone]  3:14 PM

## 2019-08-30 NOTE — Telephone Encounter (Signed)
New Message   Pt says her family has covid, and she was negative when she got tested but is now having SOB   STAT if HR is under 50 or over 120 (normal HR is 60-100 beats per minute)  1) What is your heart rate? HR mid to high 130's and low 140's  Pt says it has gone to 147 and this is resting HR  Right now it is 138 oxygen is 96   2) Do you have a log of your heart rate readings (document readings)? Yes  3) Do you have any other symptoms? SOB, pt says yesterday her oxygen was 97/98 and today it is 93/94    Pt is wondering if there is any place that can come to her house for a covid test

## 2019-08-30 NOTE — Assessment & Plan Note (Signed)
Covid exposure: Was exposed last week, so far has no symptoms, I do recommend her to be tested, she unfortunately is unable to because she depends on her daughter and son-in-law for transportation and they are both have symptoms.  We agreed to stay at home, continue present care, avoid further exposure, continue checking her O2sats. Her O2 sat are typically 96-97% and occasionally they go down to 90 to 93%.  If her oxygen drops and stay below 94% or if she has symptoms such as fever, chills, myalgias or increased shortness of breath >>> knows to call or seek immediate medical attention. DM: Based on last A1c, I recommended Farxiga, she declined, is working on diet, ambulatory CBGs are better, fasting in the morning in the 110, 125. Good compliance with Glimepiride, Metformin. Anxiety depression: The patient lost her husband recently, condolences provided.  They were married for many years, paradoxically she feels "better" after he passed because he was on dialysis and it hurt her to see him suffer .  We had a long discussion about the issue,  provided listening therapy.  At the end of the conversation she did feel somewhat better. Atrial fibrillation: New onset, saw cardiology a month ago.  Currently on Eliquis.  Cost is an issue, we were able to secure her some samples and she will pick them up once we are sure she does not have Covid. RTC 2 months, will call and schedule

## 2019-09-01 DIAGNOSIS — Z20822 Contact with and (suspected) exposure to covid-19: Secondary | ICD-10-CM | POA: Diagnosis not present

## 2019-09-03 ENCOUNTER — Telehealth: Payer: Self-pay | Admitting: Cardiovascular Disease

## 2019-09-03 NOTE — Telephone Encounter (Signed)
Spoke with patient. States that she needs 5mg  eliguis samples along with co-pay card and the 30 day free card. Pt states that since no one in her family is able to come pick up the samples, she is sending a Midwife. [employer] of the [facility] courier service will pick up her samples. Notified pt that this should be fine and the samples will be left up front for her. No other questions or concerns at this time.

## 2019-09-03 NOTE — Telephone Encounter (Signed)
Pt needs 5mg  Eliquis samples and co-pay cards. Please call to figure out who is picking up medications.

## 2019-09-03 NOTE — Telephone Encounter (Signed)
New message:     Patient calling stating that she needs to speak with some concering some medications issue.

## 2019-09-03 NOTE — Telephone Encounter (Signed)
If she can get someone to come by, we can give her the cards for free 30 days and then $10 copay for this medication. Her grandson can come get this for her.   Becky Stanley, [place]  [address] [place], [place] [address] [phone]  12:43 PM

## 2019-09-03 NOTE — Telephone Encounter (Signed)
Called pt. Stated that Eliquis was going to cost her $400 and she could not afford that. She said she was told there was a prescription card or coupon that she could use to get it cheaper and she was needing Eliquis samples to hold her over until she got her medications filled. She stated that her whole family has Covid right now and she would have to send a messenger service to pick up the samples and prescription card. Will send to primary RN for review. She might need a PA and needs a call back to get messengers name.

## 2019-09-05 ENCOUNTER — Telehealth: Payer: Self-pay | Admitting: Cardiovascular Disease

## 2019-09-05 NOTE — Telephone Encounter (Signed)
Will be on the look out- Dr.O'Neal is back in office on Friday.

## 2019-09-05 NOTE — Telephone Encounter (Signed)
Patient is requesting to speak with Dr. Heather Roberts nurse. She states some things have changed and there is a fax that should be coming. She states it is a lot of information that she would rather not confuse me with.

## 2019-09-05 NOTE — Telephone Encounter (Signed)
Contacted patient- she will have to try to do patient assistance forms- she will let us know once we have received their portion and we can have Dr.O'Neal fill out his portion and then they will bring theres back to [place] to put together and we will fax it in for her.   Patient agreed with this plan, will call her once we have the paperwork for her to come pick up

## 2019-09-05 NOTE — Telephone Encounter (Signed)
New Message   Pt is calling to let Dr Marisue Ivan know that a fax will be coming regarding her Eliquis that will need to be filled out and sent back    Please advise

## 2019-09-06 ENCOUNTER — Ambulatory Visit (INDEPENDENT_AMBULATORY_CARE_PROVIDER_SITE_OTHER): Payer: Medicare Other | Admitting: Internal Medicine

## 2019-09-06 ENCOUNTER — Other Ambulatory Visit: Payer: Self-pay

## 2019-09-06 ENCOUNTER — Encounter: Payer: Self-pay | Admitting: Internal Medicine

## 2019-09-06 VITALS — Ht 63.5 in

## 2019-09-06 DIAGNOSIS — B370 Candidal stomatitis: Secondary | ICD-10-CM | POA: Diagnosis not present

## 2019-09-06 DIAGNOSIS — B3731 Acute candidiasis of vulva and vagina: Secondary | ICD-10-CM

## 2019-09-06 DIAGNOSIS — B373 Candidiasis of vulva and vagina: Secondary | ICD-10-CM | POA: Diagnosis not present

## 2019-09-06 MED ORDER — NYSTATIN 100000 UNIT/ML MT SUSP: 5.0000 mL | Freq: Four times a day (QID) | OROMUCOSAL | 0 refills | Status: DC

## 2019-09-06 MED ORDER — FLUCONAZOLE 100 MG PO TABS: ORAL_TABLET | ORAL | 0 refills | Status: DC

## 2019-09-06 NOTE — Progress Notes (Signed)
Pre visit review using our clinic review tool, if applicable. No additional management support is needed unless otherwise documented below in the visit note. 

## 2019-09-06 NOTE — Progress Notes (Signed)
Subjective:    Patient ID: Becky Stanley, female    DOB: 11-13-1945, 74 y.o.   MRN: [id number]  DOS:  09/06/2019 Type of visit - description: Virtual Visit via Telephone  Attempted  to make this a video visit, due to technical difficulties from the patient side it was not possible  thus we proceeded with a Virtual Visit via Telephone    I connected with above mentioned patient  by telephone and verified that I am speaking with the correct person using two identifiers.  THIS ENCOUNTER IS A VIRTUAL VISIT DUE TO COVID-19 - PATIENT WAS NOT SEEN IN THE OFFICE. PATIENT HAS CONSENTED TO VIRTUAL VISIT / TELEMEDICINE VISIT   Location of patient: home  Location of provider: office  I discussed the limitations, risks, security and privacy concerns of performing an evaluation and management service by telephone and the availability of in person appointments. I also discussed with the patient that there may be a patient responsible charge related to this service. The patient expressed understanding and agreed to proceed.  Acute Few days history of thrush. She had similar problems  before and reports she recognizes the problem. Her mouth is very sensitive and sowhar swollen, she typically does not have white patches. Reports that nystatin historically does not help.   Also reports vaginal yeast infection, reports the area around the vagina is extremely irritated and itchy. No vaginal discharge She is unable to see the area but when she cleans there, the skin is a slightly bumpy.  Review of Systems See above   Past Medical History:  Diagnosis Date  . Anxiety and depression   . Carpal tunnel syndrome, left   . Chronic UTI, h/o   . COPD (chronic obstructive pulmonary disease) ([place])   . Diabetes mellitus ([place])   . DJD (degenerative joint disease)   . Essential hypertension   . GERD (gastroesophageal reflux disease)   . Hyperlipidemia   . Hypertrophic cardiomyopathy ([place])   . Iron  deficiency anemia   . Vitamin D deficiency     Past Surgical History:  Procedure Laterality Date  . CHOLECYSTECTOMY    . ESOPHAGEAL DILATION     multiple procedures per Pt  . TOTAL HIP ARTHROPLASTY Bilateral     Allergies as of 09/06/2019      Reactions   Aminoglycosides    Ciprofloxacin Nausea And Vomiting   Tolerates levaquin   Erythromycin Nausea And Vomiting   Nitrofurantoin Nausea And Vomiting   Other Nausea And Vomiting   Mussels   Penicillins Hives      Medication List       Accurate as of September 06, 2019 11:59 PM. If you have any questions, ask your nurse or doctor.        albuterol 0.63 MG/3ML nebulizer solution Commonly known as: ACCUNEB Take 3 mLs (0.63 mg total) by nebulization 4 (four) times daily as needed for wheezing or shortness of breath.   albuterol 108 (90 Base) MCG/ACT inhaler Commonly known as: VENTOLIN HFA Inhale 2 puffs into the lungs every 6 (six) hours as needed for wheezing or shortness of breath.   apixaban 5 MG Tabs tablet Commonly known as: Eliquis Take 1 tablet (5 mg total) by mouth 2 (two) times daily.   clonazePAM 1 MG tablet Commonly known as: KLONOPIN TAKE 1 TABLET (1 MG TOTAL) BY MOUTH 2 (TWO) TIMES DAILY AS NEEDED FOR ANXIETY.   Drisdol 1.25 MG (50000 UT) capsule Generic drug: ergocalciferol Take 1 capsule by mouth  once a week.   Dulera 200-5 MCG/ACT Aero Generic drug: mometasone-formoterol Inhale 2 puffs into the lungs 2 (two) times daily.   esomeprazole 20 MG capsule Commonly known as: NEXIUM Take 20 mg by mouth 2 (two) times daily before a meal.   fluconazole 100 MG tablet Commonly known as: DIFLUCAN 2 tablets first day, then 1 tablet a day Started by: Becky Stanley November, MD   furosemide 40 MG tablet Commonly known as: LASIX Take one tablet twice daily for 3 days- then go back to once daily.   glimepiride 4 MG tablet Commonly known as: AMARYL TAKE 1/2 TABLET EVERY MORNING AND 1 TABLET BY MOUTH AT NIGHT     HYDROcodone-acetaminophen 5-325 MG tablet Commonly known as: NORCO/VICODIN Take 1 tablet by mouth every 6 (six) hours as needed for moderate pain.   levofloxacin 250 MG tablet Commonly known as: LEVAQUIN Take 250 mg by mouth daily.   lovastatin 40 MG tablet Commonly known as: MEVACOR TAKE 1 TABLET BY MOUTH EVERYDAY AT BEDTIME   metFORMIN 500 MG 24 hr tablet Commonly known as: GLUCOPHAGE-XR TAKE 2 TABLETS BY MOUTH TWICE A DAY   metoprolol tartrate 100 MG tablet Commonly known as: LOPRESSOR Take 1 tablet (100 mg total) by mouth 2 (two) times daily.   mupirocin ointment 2 % Commonly known as: BACTROBAN Apply 1 application topically 2 (two) times daily as needed.   nystatin 100000 UNIT/ML suspension Commonly known as: MYCOSTATIN Take 5 mLs (500,000 Units total) by mouth 4 (four) times daily. Started by: Becky Stanley November, MD   OneTouch Verio test strip Generic drug: glucose blood TEST ONCE DAILY   sertraline 100 MG tablet Commonly known as: ZOLOFT Take 1.5 tablets (150 mg total) by mouth daily.             Objective:   Physical Exam Ht 5' 3.5" (1.613 m)  This is a Hospital doctor telephone visit.  She is alert oriented x3, in no distress.  No difficulty breathing, voice normal.    Assessment    Assessmnt (new patient 10-2018 referred by her daughter Becky Stanley) DM HTN High cholesterol Alcoholic sober since [id number], [place] meetings  Anxiety: On sertraline, clonazepam Pulmonary: Asthma, COPD, smoker/vaping ----admitted 06/2017: Acute hypoxic respiratory failure ----CT of the chest in August 2017 which revealed mild groundglass changes primarily in the upper lobes with no noted masses, nodules, or lesions Iron deficiency anemia CV: ----CHF DX 06/2017 ----HCOM (with excessive diuresis) ----Arrhythmia (used to see cards) ----Atrial fibrillation, new DX 07/27/2019 DJD Migraines: On hydrocodone as needed Recurrent UTIs: At some point saw infectious diseases, was Rx Levaquin for 3 days as  needed.  She is allergic to Cipro but tolerates Levaquin.  As of 10-2018, UTIs are less frequent.   PLAN: Thrush, vaginal yeast infection: Advise patient that this is a telephone visit, I will have to trust her justment  That sxs are indeed thrush and vaginal infection because I cannot see her. She said that her symptoms are classic and consistent with previous episodes. States that nystatin does not work and she needs "the pill". Plan: Fluconazole for 5 days, she is aware that that interacts with lovastatin, to hold lovastatin for 1 week. I asked her to try nystatin despite the fact that in the past it did not help, prescription sent Prompt reevaluation if she is not better. She verbalized understanding I discussed the assessment and treatment plan with the patient. The patient was provided an opportunity to ask questions and all were answered. The patient agreed with  the plan and demonstrated an understanding of the instructions.   The patient was advised to call back or seek an in-person evaluation if the symptoms worsen or if the condition fails to improve as anticipated.  I provided 15 minutes of non-face-to-face time during this encounter.  Becky Stanley November, MD

## 2019-09-08 NOTE — Assessment & Plan Note (Signed)
Thrush, vaginal yeast infection: Advise patient that this is a telephone visit, I will have to trust her justment  That sxs are indeed thrush and vaginal infection because I cannot see her. She said that her symptoms are classic and consistent with previous episodes. States that nystatin does not work and she needs "the pill". Plan: Fluconazole for 5 days, she is aware that that interacts with lovastatin, to hold lovastatin for 1 week. I asked her to try nystatin despite the fact that in the past it did not help, prescription sent Prompt reevaluation if she is not better. She verbalized understanding I discussed the assessment and treatment plan with the patient. The patient was provided an opportunity to ask questions and all were answered. The patient agreed with the plan and demonstrated an understanding of the instructions.

## 2019-09-09 ENCOUNTER — Ambulatory Visit: Payer: Medicare Other | Attending: Internal Medicine

## 2019-09-09 DIAGNOSIS — Z23 Encounter for immunization: Secondary | ICD-10-CM

## 2019-09-09 NOTE — Progress Notes (Signed)
   Covid-19 Vaccination Clinic  Name:  MYSTI MANE    MRN: [id number] DOB: 1945-11-05  09/09/2019  Ms. Lucken was observed post Covid-19 immunization for 15 minutes without incidence. She was provided with Vaccine Information Sheet and instruction to access the V-Safe system.   Ms. Westerkamp was instructed to call 911 with any severe reactions post vaccine: Marland Kitchen Difficulty breathing  . Swelling of your face and throat  . A fast heartbeat  . A bad rash all over your body  . Dizziness and weakness    Immunizations Administered    Name Date Dose VIS Date Route   Pfizer COVID-19 Vaccine 09/09/2019  1:36 PM 0.3 mL 06/22/2019 Intramuscular   Manufacturer: [place]   Lot: [id number]   [place]: [id number]

## 2019-09-11 ENCOUNTER — Telehealth: Payer: Self-pay | Admitting: Cardiovascular Disease

## 2019-09-11 NOTE — Telephone Encounter (Signed)
Becky Stanley is calling to check on the status of the [employer] scrib patient assistance foundation forms for her Eliquis that were to be signed by Dr. Audie Box and faxed back. She would like to know if it has been completed and if so where it goes from here. Please advise.

## 2019-09-11 NOTE — Telephone Encounter (Signed)
Spoke with Becky Stanley, aware julie is not in the office and will send the message to her and she will be back in contact with her. She reports she will be out of meds before she will be able to get approved and can not pick up samples so she will take a baby aspirin until she gets the meds.

## 2019-09-12 NOTE — Telephone Encounter (Signed)
Contacted patient- advised that we just received the patient assistance information- I advised with patient that Dr.O'Neal has to sign it, and he will return on Monday- patient states they have their section of the forms, and the paperwork that is needed and will bring it with them when they come to the appointment on 03/10 so I can fax it over then.   Patient states they are unable to come get any samples of the medication at this time, will notify Dr.O'Neal of what the patient will be taking until she is seen next week.  Thank you!

## 2019-09-12 NOTE — Telephone Encounter (Signed)
See previous phone message. 

## 2019-09-17 ENCOUNTER — Institutional Professional Consult (permissible substitution): Payer: Medicare Other | Admitting: Pulmonary Disease

## 2019-09-18 ENCOUNTER — Other Ambulatory Visit: Payer: Self-pay

## 2019-09-18 MED ORDER — ONETOUCH DELICA LANCETS 33G MISC: 12 refills | Status: AC

## 2019-09-18 MED ORDER — ONETOUCH VERIO VI STRP: ORAL_STRIP | 12 refills | Status: DC

## 2019-09-19 ENCOUNTER — Ambulatory Visit: Payer: Medicare Other | Admitting: Cardiovascular Disease

## 2019-09-21 ENCOUNTER — Other Ambulatory Visit: Payer: Self-pay | Admitting: Internal Medicine

## 2019-09-27 NOTE — Progress Notes (Signed)
Cardiology Office Note:   Date:  09/28/2019  NAME:  Becky Stanley    MRN: [id number] DOB:  10/24/1945   PCP:  Colon Branch, MD  Cardiologist:  Evalina Field, MD   Referring MD: Colon Branch, MD   Chief Complaint  Patient presents with  . Atrial Fibrillation   History of Present Illness:   Becky Stanley is a 74 y.o. female with a hx of apical variant HCM, dCHF, persistent afib, HTN, obesity, COPD who presents for follow-up. Seen in January for new onset Afib. Reports profound issues with swallowing so we decided to anticoagulate and then consider DCCV at later date.   She presents with significant lower extremity edema.  She also has elevated JVD on examination.  She reports she still getting quite short of breath with minimal activity.  Activities such as a shower get her quite short of breath.  She is on Eliquis and tolerating this well.  We will work with her on effective cost measures to get the cost lower.  She is also working on salt restriction.  She has been in a scooter since 2007.  She is smoking e-cigarettes.  Heart rate is still bouncing around.  She reports that she can feel her heart racing at times.  She has not established with the pulmonary doctor but will next week.  I think she has a bit of fluid on her and her Lasix is not working as well as it did in the past.  She is working on salt restriction.  She is completed 1 of 2 Covid vaccines.  Has been on Eliquis since January without missing doses.  I did discuss cardioversion with her.  I think the best thing is to get fluid off of her and then see her back in 2 weeks and then discuss a cardioversion.  Problem List 1. 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 HTN 3. Diastolic heart failure  4. Obesity 5.  Diabetes (A1c 7.8) 6. Atrial fibrillation, persistent  7. Tobacco abuse  8. COPD  Past Medical History: Past Medical History:  Diagnosis Date  . Anxiety and depression     . Carpal tunnel syndrome, left   . Chronic UTI, h/o   . COPD (chronic obstructive pulmonary disease) ([place])   . Diabetes mellitus ([place])   . DJD (degenerative joint disease)   . Essential hypertension   . GERD (gastroesophageal reflux disease)   . Hyperlipidemia   . Hypertrophic cardiomyopathy ([place])   . Iron deficiency anemia   . Vitamin D deficiency     Past Surgical History: Past Surgical History:  Procedure Laterality Date  . CHOLECYSTECTOMY    . ESOPHAGEAL DILATION     multiple procedures per Pt  . TOTAL HIP ARTHROPLASTY Bilateral     Current Medications: Current Meds  Medication Sig  . albuterol (ACCUNEB) 0.63 MG/3ML nebulizer solution Take 3 mLs (0.63 mg total) by nebulization 4 (four) times daily as needed for wheezing or shortness of breath.  Marland Kitchen albuterol (VENTOLIN HFA) 108 (90 Base) MCG/ACT inhaler Inhale 2 puffs into the lungs every 6 (six) hours as needed for wheezing or shortness of breath.  Marland Kitchen apixaban (ELIQUIS) 5 MG TABS tablet Take 1 tablet (5 mg total) by mouth 2 (two) times daily.  . clonazePAM (KLONOPIN) 1 MG tablet TAKE 1 TABLET (1 MG TOTAL) BY MOUTH 2 (TWO) TIMES DAILY AS NEEDED FOR ANXIETY.  . ergocalciferol (DRISDOL) 1.25 MG (50000  UT) capsule Take 1 capsule by mouth once a week.  . esomeprazole (NEXIUM) 20 MG capsule Take 20 mg by mouth 2 (two) times daily before a meal.  . glimepiride (AMARYL) 4 MG tablet TAKE 1/2 TABLET EVERY MORNING AND 1 TABLET BY MOUTH AT NIGHT  . glucose blood (ONETOUCH VERIO) test strip Check blood sugar once daily  . HYDROcodone-acetaminophen (NORCO/VICODIN) 5-325 MG tablet Take 1 tablet by mouth every 6 (six) hours as needed for moderate pain.  Marland Kitchen levofloxacin (LEVAQUIN) 250 MG tablet Take 250 mg by mouth daily.  Marland Kitchen lovastatin (MEVACOR) 40 MG tablet TAKE 1 TABLET BY MOUTH EVERYDAY AT BEDTIME  . metFORMIN (GLUCOPHAGE-XR) 500 MG 24 hr tablet TAKE 2 TABLETS BY MOUTH TWICE A DAY  . metoprolol tartrate (LOPRESSOR) 100 MG tablet Take 1  tablet (100 mg total) by mouth 2 (two) times daily.  . mometasone-formoterol (DULERA) 200-5 MCG/ACT AERO Inhale 2 puffs into the lungs 2 (two) times daily.  . mupirocin ointment (BACTROBAN) 2 % Apply 1 application topically 2 (two) times daily as needed.  [name] MISC Check blood sugars once daily  . sertraline (ZOLOFT) 100 MG tablet TAKE 1 AND 1/2 TABLETS BY MOUTH EVERY DAY  . [DISCONTINUED] furosemide (LASIX) 40 MG tablet Take one tablet twice daily for 3 days- then go back to once daily.     Allergies:    Aminoglycosides, Ciprofloxacin, Erythromycin, Nitrofurantoin, Other, and Penicillins   Social History: Social History   Socioeconomic History  . Marital status: Widowed    Spouse name: Dominica Severin  . Number of children: 2  . Years of education: Not on file  . Highest education level: Not on file  Occupational History  . Occupation: retired  Tobacco Use  . Smoking status: Current Some Day Smoker    Years: 35.00    Types: E-cigarettes  . Smokeless tobacco: Never Used  . Tobacco comment: quit 20 years, started back about 5 years ago, vaping  Substance and Sexual Activity  . Alcohol use: Never    Comment: h/o ETOH abuse   . Drug use: Not on file  . Sexual activity: Not on file  Other Topics Concern  . Not on file  Social History Narrative   Moved from [place] to the [place] area home 10/12/2018; lives at her daughter.   Lost husband Dominica Severin 06/2019   Former smoker, as of 10-2018 vaping with plans to quit   Social Determinants of Health   Financial Resource Strain:   . Difficulty of Paying Living Expenses:   Food Insecurity:   . Worried About Charity fundraiser in the Last Year:   . Arboriculturist in the Last Year:   Transportation Needs:   . Film/video editor (Medical):   Marland Kitchen Lack of Transportation (Non-Medical):   Physical Activity:   . Days of Exercise per Week:   . Minutes of Exercise per Session:   Stress:   . Feeling of Stress :   Social  Connections:   . Frequency of Communication with Friends and Family:   . Frequency of Social Gatherings with Friends and Family:   . Attends Religious Services:   . Active Member of Clubs or Organizations:   . Attends Archivist Meetings:   Marland Kitchen Marital Status:      Family History: The patient's family history includes CAD in her paternal grandfather; COPD in her father; Colon cancer (age of onset: 5) in her father; Multiple sclerosis in her mother.  There is no history of Breast cancer.  ROS:   All other ROS reviewed and negative. Pertinent positives noted in the HPI.     EKGs/Labs/Other Studies Reviewed:   The following studies were personally reviewed by me today  TTE 06/29/2019 1. Left ventricular ejection fraction, by visual estimation, is 50 to  55%. The left ventricle has normal function. There is mildly increased  left ventricular hypertrophy.  2. Elevated left atrial pressure.  3. Left ventricular diastolic parameters are consistent with Grade II  diastolic dysfunction (pseudonormalization).  4. The left ventricle demonstrates regional wall motion abnormalities.  5. Global right ventricle has normal systolic function.The right  ventricular size is normal.  6. Left atrial size was mildly dilated.  7. Right atrial size was normal.  8. Mild mitral annular calcification.  9. The mitral valve is normal in structure. No evidence of mitral valve  regurgitation. No evidence of mitral stenosis.  10. The tricuspid valve is normal in structure. Tricuspid valve  regurgitation is trivial.  11. The aortic valve has an indeterminant number of cusps. Aortic valve  regurgitation is not visualized. No evidence of aortic valve sclerosis or  stenosis.  12. The pulmonic valve was not well visualized. Pulmonic valve  regurgitation is not visualized.  13. The inferior vena cava is normal in size with greater than 50%  respiratory variability, suggesting right atrial  pressure of 3 mmHg.  14. Extremely limited due to poor sound wave transmission; apical akinesis  with turbulence in mid LVOT and apex; overall low normal LV systolic  function; grade 2 diastolic dysfunction; mild LAE; suggest cardiac MRI to  better assess LV function and LV  apex.   Recent Labs: 06/15/2019: ALT 39; BUN 15; Creat 0.77; Potassium 4.0; Sodium 136; TSH 1.81 07/27/2019: Hemoglobin 13.7; Platelets 294   Recent Lipid Panel    Component Value Date/Time   CHOL 141 03/01/2019 1358   TRIG 134.0 03/01/2019 1358   HDL 40.00 03/01/2019 1358   CHOLHDL 4 03/01/2019 1358   VLDL 26.8 03/01/2019 1358   LDLCALC 74 03/01/2019 1358    Physical Exam:   VS:  BP 112/70 (BP Location: Right Arm, Patient Position: Sitting)   Pulse 64   Temp (!) 97 F (36.1 C)   Ht 5\' 4"  (1.626 m)   SpO2 95%    Wt Readings from Last 3 Encounters:  No data found for Wt    General: Well nourished, well developed, in no acute distress Heart: Atraumatic, normal size  Eyes: PEERLA, EOMI  Neck: JVD 12-15 cm of water Endocrine: No thryomegaly Cardiac: Normal S1, S2; RRR; no murmurs, rubs, or gallops Lungs: Clear bilaterally Abd: Soft, nontender, no hepatomegaly  Ext: 2+ pitting edema Musculoskeletal: No deformities, BUE and BLE strength normal and equal Skin: Warm and dry, no rashes   Neuro: Alert and oriented to person, place, time, and situation, CNII-XII grossly intact, no focal deficits  Psych: Normal mood and affect   ASSESSMENT:   Roda A Hoerner is a 74 y.o. female who presents for the following: 1. Hypertrophic cardiomyopathy ([place])   2. Chronic diastolic heart failure (HCC)   3. Persistent atrial fibrillation ([place])   4. Essential hypertension   5. Tobacco abuse     PLAN:   1. Hypertrophic cardiomyopathy ([place]) -Apical variant hypertrophic dermopathy.  She does have what appears to be an apical aneurysm.  She is anticoagulated.  Given her age and apical variant this is low risk from a sudden  death standpoint.  We will not consider ICD implantation.  2. Chronic diastolic heart failure (HCC) -Evidence of volume on examination today.  We will check a BNP as well as BMP.  We will then switch her over to torsemide 20 mg daily.  We will stop her Lasix.  Hopefully she will get better benefit with torsemide.  She will let us know how she does over the next few days and we can increase the dose if needed.  I will plan to see her back in office in 2 weeks.  3. Persistent atrial fibrillation ([place]) -Remains in atrial fibrillation.  Pulses are regular on my examination today.  She is anticoagulated due to high CHA2DS2-VASc as well as her hypertrophic dermopathy.  Tolerating Eliquis well without any issues.  We will get her some assistance so she can afford this medication.  I will see her back in 2 weeks and we can discuss the possibility of cardioversion.  I suspect she may get some benefit from not being in A. fib but due to the fact that she has some volume on her we should get this off before we attempt a cardioversion. -Continue with Toprol tartrate 100 mg twice daily for rate control  4. Essential hypertension -Well-controlled today.  We will continue her metoprolol tartrate 100 mg twice daily for rate control.  5. Tobacco abuse -She uses e-cigarettes.  She is now considerably.  Did see pulmonology for COPD when she was in [place].  I have referred her to pulmonology.  She will see the next week.   Disposition: Return in about 2 weeks (around 10/12/2019).  Medication Adjustments/Labs and Tests Ordered: Current medicines are reviewed at length with the patient today.  Concerns regarding medicines are outlined above.  Orders Placed This Encounter  Procedures  . Basic metabolic panel  . Brain natriuretic peptide   Meds ordered this encounter  Medications  . torsemide (DEMADEX) 20 MG tablet    Sig: Take 1 tablet (20 mg total) by mouth daily.    Dispense:  180 tablet    Refill:  3     Patient Instructions  Medication Instructions:  Stop Lasix  Start Torsemide 20 mg daily   *If you need a refill on your cardiac medications before your next appointment, please call your pharmacy*   Lab Work: BMET, BNP  If you have labs (blood work) drawn today and your tests are completely normal, you will receive your results only by: Marland Kitchen MyChart Message (if you have MyChart) OR . A paper copy in the mail If you have any lab test that is abnormal or we need to change your treatment, we will call you to review the results.   Follow-Up: At [facility], you and your health needs are our priority.  As part of our continuing mission to provide you with exceptional heart care, we have created designated Provider Care Teams.  These Care Teams include your primary Cardiologist (physician) and Advanced Practice Providers (APPs -  Physician Assistants and Nurse Practitioners) who all work together to provide you with the care you need, when you need it.  We recommend signing up for the patient portal called "MyChart".  Sign up information is provided on this After Visit Summary.  MyChart is used to connect with patients for Virtual Visits (Telemedicine).  Patients are able to view lab/test results, encounter notes, upcoming appointments, etc.  Non-urgent messages can be sent to your provider as well.   To learn more about what you can do with  MyChart, go to [email or web address].    Your next appointment:   2 week(s)  The format for your next appointment:   In Person  Provider:   Eleonore Chiquito, MD       Time Spent with Patient: I have spent a total of 35 minutes with patient reviewing hospital notes, telemetry, EKGs, labs and examining the patient as well as establishing an assessment and plan that was discussed with the patient.  > 50% of time was spent in direct patient care.  Signed, Addison Naegeli. Audie Box, [place]  [address] [place], [place] [address] [phone]  09/28/2019 3:21 PM

## 2019-09-28 ENCOUNTER — Other Ambulatory Visit: Payer: Self-pay

## 2019-09-28 ENCOUNTER — Encounter: Payer: Self-pay | Admitting: Cardiovascular Disease

## 2019-09-28 ENCOUNTER — Ambulatory Visit (INDEPENDENT_AMBULATORY_CARE_PROVIDER_SITE_OTHER): Payer: Medicare Other | Admitting: Cardiovascular Disease

## 2019-09-28 VITALS — BP 112/70 | HR 64 | Temp 97.0°F | Ht 64.0 in

## 2019-09-28 DIAGNOSIS — I5032 Chronic diastolic (congestive) heart failure: Secondary | ICD-10-CM | POA: Diagnosis not present

## 2019-09-28 DIAGNOSIS — I422 Other hypertrophic cardiomyopathy: Secondary | ICD-10-CM | POA: Diagnosis not present

## 2019-09-28 DIAGNOSIS — I10 Essential (primary) hypertension: Secondary | ICD-10-CM

## 2019-09-28 DIAGNOSIS — I4819 Other persistent atrial fibrillation: Secondary | ICD-10-CM | POA: Diagnosis not present

## 2019-09-28 DIAGNOSIS — Z720 Tobacco use: Secondary | ICD-10-CM | POA: Diagnosis not present

## 2019-09-28 MED ORDER — TORSEMIDE 20 MG PO TABS: 20.0000 mg | ORAL_TABLET | Freq: Every day | ORAL | 3 refills | Status: DC

## 2019-09-28 NOTE — Patient Instructions (Signed)
Medication Instructions:  Stop Lasix  Start Torsemide 20 mg daily   *If you need a refill on your cardiac medications before your next appointment, please call your pharmacy*   Lab Work: BMET, BNP  If you have labs (blood work) drawn today and your tests are completely normal, you will receive your results only by: Marland Kitchen MyChart Message (if you have MyChart) OR . A paper copy in the mail If you have any lab test that is abnormal or we need to change your treatment, we will call you to review the results.   Follow-Up: At [facility], you and your health needs are our priority.  As part of our continuing mission to provide you with exceptional heart care, we have created designated Provider Care Teams.  These Care Teams include your primary Cardiologist (physician) and Advanced Practice Providers (APPs -  Physician Assistants and Nurse Practitioners) who all work together to provide you with the care you need, when you need it.  We recommend signing up for the patient portal called "MyChart".  Sign up information is provided on this After Visit Summary.  MyChart is used to connect with patients for Virtual Visits (Telemedicine).  Patients are able to view lab/test results, encounter notes, upcoming appointments, etc.  Non-urgent messages can be sent to your provider as well.   To learn more about what you can do with MyChart, go to [email or web address].    Your next appointment:   2 week(s)  The format for your next appointment:   In Person  Provider:   Eleonore Chiquito, MD

## 2019-09-29 LAB — BASIC METABOLIC PANEL
BUN/Creatinine Ratio: 18 (ref 12–28)
BUN: 14 mg/dL (ref 8–27)
CO2: 21 mmol/L (ref 20–29)
Calcium: 9 mg/dL (ref 8.7–10.3)
Chloride: 101 mmol/L (ref 96–106)
Creatinine, Ser: 0.8 mg/dL (ref 0.57–1.00)
GFR calc Af Amer: 85 mL/min/{1.73_m2} (ref >59)
GFR calc non Af Amer: 74 mL/min/{1.73_m2} (ref >59)
Glucose: 87 mg/dL (ref 65–99)
Potassium: 4.6 mmol/L (ref 3.5–5.2)
Sodium: 142 mmol/L (ref 134–144)

## 2019-09-29 LAB — BRAIN NATRIURETIC PEPTIDE: BNP: 348.7 pg/mL — ABNORMAL HIGH (ref 0.0–100.0)

## 2019-10-01 ENCOUNTER — Telehealth: Payer: Self-pay | Admitting: Cardiovascular Disease

## 2019-10-01 NOTE — Telephone Encounter (Signed)
Called Mrs. Brothers. Better urine output with torsemide. Cr stable. Will continue with this for now.   [place] T. Audie Box, [place]  [address] [place], [place] [address] [phone]  1:34 PM

## 2019-10-08 ENCOUNTER — Other Ambulatory Visit: Payer: Self-pay

## 2019-10-08 ENCOUNTER — Encounter: Payer: Self-pay | Admitting: Pulmonary Disease

## 2019-10-08 ENCOUNTER — Ambulatory Visit (INDEPENDENT_AMBULATORY_CARE_PROVIDER_SITE_OTHER): Payer: Medicare Other | Admitting: Pulmonary Disease

## 2019-10-08 VITALS — BP 106/70 | HR 117 | Temp 97.4°F | Ht 64.0 in

## 2019-10-08 DIAGNOSIS — J449 Chronic obstructive pulmonary disease, unspecified: Secondary | ICD-10-CM

## 2019-10-08 DIAGNOSIS — F17210 Nicotine dependence, cigarettes, uncomplicated: Secondary | ICD-10-CM | POA: Diagnosis not present

## 2019-10-08 DIAGNOSIS — Z720 Tobacco use: Secondary | ICD-10-CM

## 2019-10-08 MED ORDER — TRELEGY ELLIPTA 200-62.5-25 MCG/INH IN AEPB: 1.0000 | INHALATION_SPRAY | Freq: Every day | RESPIRATORY_TRACT | 0 refills | Status: DC

## 2019-10-08 NOTE — Progress Notes (Signed)
Subjective:   PATIENT ID: Becky Stanley GENDER: female DOB: 03-08-1946, MRN: [id number]   HPI  Chief Complaint  Patient presents with  . Consult    COPD/Asthma.  sob, wheezing without exertion and with talking.  dry cough.    Reason for Visit: New consult for COPD  Becky Stanley is a 74 year old female with HCM, chronic diastolic heart failure, atrial fibrillation who presents as a referral for COPD.  She was referred to Pulmonary clinic by her Cardiologist, Dr. Audie Box. Note from 09/28/19 was reviewed and was noted to have shortness of breath and lower extremity edema during this visit. Her diuretic regimen was adjusted with improved symptoms however continues to have persistent dyspnea.  She was previously followed by Pulmonology in [place]. She was diagnosed with COPD three years ago when she was hospitalized. She is currently on Dulera 200-5 mcg TWO puffs TWICE a day. She notices that medication runs out. She has shortness of breath, wheezing and mixed cough worsened with talking and movement. She has chronic chest congestion. Reports at least a 30 pack-year history and quit 20 years ago however started vaping 5 years ago due to stressors. She is interested in quitting but acknowledges that she is having difficulty with motivation. Not able to take wellbutrin (caused reflux) and not interested in chantix (heard about hallucinations/agitation)  I have personally reviewed patient's past medical/family/social history, allergies, current medications.  Past Medical History:  Diagnosis Date  . Anxiety and depression   . Carpal tunnel syndrome, left   . Chronic UTI, h/o   . COPD (chronic obstructive pulmonary disease) ([place])   . Diabetes mellitus ([place])   . DJD (degenerative joint disease)   . Essential hypertension   . GERD (gastroesophageal reflux disease)   . Hyperlipidemia   . Hypertrophic cardiomyopathy ([place])   . Iron deficiency anemia   . Vitamin D deficiency      Family  History  Problem Relation Age of Onset  . Colon cancer Father 62  . COPD Father   . CAD Paternal Grandfather   . Multiple sclerosis Mother   . Breast cancer Neg Hx      Social History   Occupational History  . Occupation: retired  Tobacco Use  . Smoking status: Former Smoker    Packs/day: 1.00    Years: 35.00    Pack years: 35.00    Types: E-cigarettes    Start date: 07/12/1958    Quit date: 09/10/1998    Years since quitting: 21.0  . Smokeless tobacco: Never Used  . Tobacco comment: quit 20 years, started back about 5 years ago, vaping  Substance and Sexual Activity  . Alcohol use: Never    Comment: h/o ETOH abuse   . Drug use: Never  . Sexual activity: Not on file    Allergies  Allergen Reactions  . Aminoglycosides   . Ciprofloxacin Nausea And Vomiting    Tolerates levaquin  . Erythromycin Nausea And Vomiting  . Nitrofurantoin Nausea And Vomiting  . Other Nausea And Vomiting    [place]  . Penicillins Hives     Outpatient Medications Prior to Visit  Medication Sig Dispense Refill  . albuterol (ACCUNEB) 0.63 MG/3ML nebulizer solution Take 3 mLs (0.63 mg total) by nebulization 4 (four) times daily as needed for wheezing or shortness of breath. 75 mL 2  . albuterol (VENTOLIN HFA) 108 (90 Base) MCG/ACT inhaler Inhale 2 puffs into the lungs every 6 (six) hours as needed for wheezing  or shortness of breath.    Marland Kitchen apixaban (ELIQUIS) 5 MG TABS tablet Take 1 tablet (5 mg total) by mouth 2 (two) times daily. 60 tablet 11  . clonazePAM (KLONOPIN) 1 MG tablet TAKE 1 TABLET (1 MG TOTAL) BY MOUTH 2 (TWO) TIMES DAILY AS NEEDED FOR ANXIETY. 60 tablet 0  . ergocalciferol (DRISDOL) 1.25 MG (50000 UT) capsule Take 1 capsule by mouth once a week.    . esomeprazole (NEXIUM) 20 MG capsule Take 20 mg by mouth 2 (two) times daily before a meal.    . glimepiride (AMARYL) 4 MG tablet TAKE 1/2 TABLET EVERY MORNING AND 1 TABLET BY MOUTH AT NIGHT 135 tablet 1  . glucose blood (ONETOUCH VERIO) test  strip Check blood sugar once daily 100 each 12  . HYDROcodone-acetaminophen (NORCO/VICODIN) 5-325 MG tablet Take 1 tablet by mouth every 6 (six) hours as needed for moderate pain.    Marland Kitchen levofloxacin (LEVAQUIN) 250 MG tablet Take 250 mg by mouth daily.    Marland Kitchen lovastatin (MEVACOR) 40 MG tablet TAKE 1 TABLET BY MOUTH EVERYDAY AT BEDTIME 90 tablet 1  . metFORMIN (GLUCOPHAGE-XR) 500 MG 24 hr tablet TAKE 2 TABLETS BY MOUTH TWICE A DAY 360 tablet 1  . metoprolol tartrate (LOPRESSOR) 100 MG tablet Take 1 tablet (100 mg total) by mouth 2 (two) times daily. 180 tablet 3  . mometasone-formoterol (DULERA) 200-5 MCG/ACT AERO Inhale 2 puffs into the lungs 2 (two) times daily. 39 g 3  . mupirocin ointment (BACTROBAN) 2 % Apply 1 application topically 2 (two) times daily as needed. 22 g 0  . OneTouch Delica Lancets [id number] MISC Check blood sugars once daily 100 each 12  . sertraline (ZOLOFT) 100 MG tablet TAKE 1 AND 1/2 TABLETS BY MOUTH EVERY DAY 135 tablet 2  . torsemide (DEMADEX) 20 MG tablet Take 1 tablet (20 mg total) by mouth daily. 180 tablet 3   No facility-administered medications prior to visit.    Review of Systems  Constitutional: Negative for chills, diaphoresis, fever, malaise/fatigue and weight loss.  HENT: Negative for congestion.   Respiratory: Positive for cough, shortness of breath and wheezing. Negative for hemoptysis and sputum production.   Cardiovascular: Negative for chest pain, palpitations and leg swelling.     Objective:   Vitals:   10/08/19 1408  BP: 106/70  Pulse: (!) 117  Temp: (!) 97.4 F (36.3 C)  TempSrc: Temporal  SpO2: 93%  Height: 5\' 4"  (1.626 m)   SpO2: 93 % O2 Device: None (Room air)  Physical Exam: General: Well-appearing, no acute distress HENT: [place], AT Eyes: EOMI, no scleral icterus Respiratory: Clear to auscultation bilaterally.  No crackles, wheezing or rales Cardiovascular: RRR, -M/R/G, no JVD GI: BS+, soft,  nontender Extremities:-Edema,-tenderness Neuro: AAO x4, CNII-XII grossly intact Skin: Intact, no rashes or bruising Psych: Normal mood, normal affect  Data Reviewed:  Imaging: CXR 06/15/2019 - No infiltrate, effusion or edema. Minimal lingula atelectasis  PFT: None on file     Assessment & Plan:   Discussion: 74 year old female active smoker with COPD of unknown severity with chronic bronchitis, HCM, chronic diastolic heart failure and atrial fibrillation who presents to establish Pulmonary care. She appear euvolemic on her current diuretic regimen. Her bronchitis is not well-controlled on her current LABA/ICS inhaler despite compliance. She likely has COPD based on smoking history and symptoms and would benefit from the addition of a LAMA to her bronchodilator regimen. We also discussed smoking cessation. She prefers nicotine patches for now  as she states an intolerance to gum/lozenges and medication aids.  COPD --Provide sample of Trelegy. Take ONE puff ONCE a day --STOP Dulera while taking Trelegy --Please arrange for Pulmonary Pharmacy to review options --Arrange for pulmonary function tests  Tobacco Abuse Patient is an active smoker. We discussed smoking cessation for >10 minutes. We discussed triggers and stressors and ways to deal with them. We discussed barriers to continued smoking and benefits of smoking cessation. Provided patient with information cessation techniques and interventions including  quitline.  Health Maintenance Immunization History  Administered Date(s) Administered  . Influenza, High Dose Seasonal PF 06/20/2019  . Influenza-Unspecified 05/16/2006, 04/17/2009, 06/01/2010  . PFIZER SARS-COV-2 Vaccination 09/09/2019  . Pneumococcal-Unspecified 03/30/2007   CT Lung Screen - will discuss at next visit  Orders Placed This Encounter  Procedures  . Pulmonary function test    Standing Status:   Future    Standing Expiration Date:   10/07/2020    Order  Specific Question:   Where should this test be performed?    Answer:   [facility]    Order Specific Question:   Full PFT: includes the following: basic spirometry, spirometry pre & post bronchodilator, diffusion capacity (DLCO), lung volumes    Answer:   Full PFT    Order Specific Question:   MIP/MEP    Answer:   No    Order Specific Question:   6 minute walk    Answer:   No    Order Specific Question:   ABG    Answer:   No    Order Specific Question:   Diffusion capacity (DLCO)    Answer:   Yes    Order Specific Question:   Lung volumes    Answer:   Yes    Order Specific Question:   Methacholine challenge    Answer:   No   Meds ordered this encounter  Medications  . Fluticasone-Umeclidin-Vilant (TRELEGY ELLIPTA) 200-62.5-25 MCG/INH AEPB    Sig: Inhale 1 puff into the lungs daily.    Dispense:  2 each    Refill:  0    Order Specific Question:   Lot Number?    Answer:   [id number]    Order Specific Question:   Expiration Date?    Answer:   02/09/2021    Order Specific Question:   Manufacturer?    Answer:   Gl[employer]SmithKline [[id number]]    Order Specific Question:   Quantity    Answer:   2    Return for after PFTs.  [place], MD [facility] 10/08/2019 9:51 PM  Office Number [phone]

## 2019-10-08 NOTE — Progress Notes (Signed)
 This encounter was created in error - please disregard.

## 2019-10-08 NOTE — Patient Instructions (Addendum)
COPD --Provide sample of Trelegy. Take ONE puff ONCE a day --STOP Dulera while taking Trelegy --Please arrange for Pulmonary Pharmacy to review options --Arrange for pulmonary function tests  Tobacco Abuse Patient is an active smoker. We discussed smoking cessation for >10 minutes. We discussed triggers and stressors and ways to deal with them. We discussed barriers to continued smoking and benefits of smoking cessation. Provided patient with information cessation techniques and interventions including [place] quitline.  Tobacco Use Disorder Tobacco use disorder (TUD) occurs when a person craves, seeks, and uses tobacco, regardless of the consequences. This disorder can cause problems with mental and physical health. It can affect your ability to have healthy relationships, and it can keep you from meeting your responsibilities at work, home, or school. Tobacco may be:  Smoked as a cigarette or cigar.  Inhaled using e-cigarettes.  Smoked in a pipe or hookah.  Chewed as smokeless tobacco.  Inhaled into the nostrils as snuff. Tobacco products contain a dangerous chemical called nicotine, which is very addictive. Nicotine triggers hormones that make the body feel stimulated and works on areas of the brain that make you feel good. These effects can make it hard for people to quit nicotine. Tobacco contains many other unsafe chemicals that can damage almost every organ in the body. Smoking tobacco also puts others in danger due to fire risk and possible health problems caused by breathing in secondhand smoke. What are the signs or symptoms? Symptoms of TUD may include:  Being unable to slow down or stop your tobacco use.  Spending an abnormal amount of time getting or using tobacco.  Craving tobacco. Cravings may last for up to 6 months after quitting.  Tobacco use that: ? Interferes with your work, school, or home life. ? Interferes with your personal and social relationships. ? Makes you  give up activities that you once enjoyed or found important.  Using tobacco even though you know that it is: ? Dangerous or bad for your health or someone else's health. ? Causing problems in your life.  Needing more and more of the substance to get the same effect (developing tolerance).  Experiencing unpleasant symptoms if you do not use the substance (withdrawal). Withdrawal symptoms may include: ? Depressed, anxious, or irritable mood. ? Difficulty concentrating. ? Increased appetite. ? Restlessness or trouble sleeping.  Using the substance to avoid withdrawal. How is this diagnosed? This condition may be diagnosed based on:  Your current and past tobacco use. Your health care provider may ask questions about how your tobacco use affects your life.  A physical exam. You may be diagnosed with TUD if you have at least two symptoms within a 34-month period. How is this treated? This condition is treated by stopping tobacco use. Many people are unable to quit on their own and need help. Treatment may include:  Nicotine replacement therapy (NRT). NRT provides nicotine without the other harmful chemicals in tobacco. NRT gradually lowers the dosage of nicotine in the body and reduces withdrawal symptoms. NRT is available as: ? Over-the-counter gums, lozenges, and skin patches. ? Prescription mouth inhalers and nasal sprays.  Medicine that acts on the brain to reduce cravings and withdrawal symptoms.  A type of talk therapy that examines your triggers for tobacco use, how to avoid them, and how to cope with cravings (behavioral therapy).  Hypnosis. This may help with withdrawal symptoms.  Joining a support group for others coping with TUD. The best treatment for TUD is usually a combination  of medicine, talk therapy, and support groups. Recovery can be a long process. Many people start using tobacco again after stopping (relapse). If you relapse, it does not mean that treatment will  not work. Follow these instructions at home:  Lifestyle  Do not use any products that contain nicotine or tobacco, such as cigarettes and e-cigarettes.  Avoid things that trigger tobacco use as much as you can. Triggers include people and situations that usually cause you to use tobacco.  Avoid drinks that contain caffeine, including coffee. These may worsen some withdrawal symptoms.  Find ways to manage stress. Wanting to smoke may cause stress, and stress can make you want to smoke. Relaxation techniques such as deep breathing, meditation, and yoga may help.  Attend support groups as needed. These groups are an important part of long-term recovery for many people. General instructions  Take over-the-counter and prescription medicines only as told by your health care provider.  Check with your health care provider before taking any new prescription or over-the-counter medicines.  Decide on a friend, family member, or smoking quit-line (such as 1-800-QUIT-NOW in the [place] that you can call or text when you feel the urge to smoke or when you need help coping with cravings.  Keep all follow-up visits as told by your health care provider and therapist. This is important. Contact a health care provider if:  You are not able to take your medicines as prescribed.  Your symptoms get worse, even with treatment. Summary  Tobacco use disorder (TUD) occurs when a person craves, seeks, and uses tobacco regardless of the consequences.  This condition may be diagnosed based on your current and past tobacco use and a physical exam.  Many people are unable to quit on their own and need help. Recovery can be a long process.  The most effective treatment for TUD is usually a combination of medicine, talk therapy, and support groups. This information is not intended to replace advice given to you by your health care provider. Make sure you discuss any questions you have with your health care  provider. Document Revised: 06/15/2017 Document Reviewed: 06/15/2017 Elsevier Patient Education  2020 [employer].

## 2019-10-09 ENCOUNTER — Ambulatory Visit: Payer: Medicare Other | Attending: Internal Medicine

## 2019-10-09 DIAGNOSIS — Z23 Encounter for immunization: Secondary | ICD-10-CM

## 2019-10-09 NOTE — Progress Notes (Signed)
   Covid-19 Vaccination Clinic  Name:  Becky Stanley    MRN: [id number] DOB: 10-14-1945  10/09/2019  Becky Stanley was observed post Covid-19 immunization for 15 minutes without incident. She was provided with Vaccine Information Sheet and instruction to access the V-Safe system.   Becky Stanley was instructed to call 911 with any severe reactions post vaccine: Marland Kitchen Difficulty breathing  . Swelling of face and throat  . A fast heartbeat  . A bad rash all over body  . Dizziness and weakness   Immunizations Administered    Name Date Dose VIS Date Route   Pfizer COVID-19 Vaccine 10/09/2019  1:27 PM 0.3 mL 06/22/2019 Intramuscular   Manufacturer: [employer]   Lot: [id number]   [place]: [id number]

## 2019-10-15 ENCOUNTER — Telehealth: Payer: Self-pay | Admitting: Pharmacist

## 2019-10-15 NOTE — Telephone Encounter (Signed)
Called patient on 10/15/2019 at 3:53 PM   Patient states she thought appointment scheduled at 10:00 AM with pharmacy team today was virtual to discuss financial assistance to inhalers. Explained to patient that pharmacy team will help with inhaler education, however, if appt is solely to help with cost then patient can discuss this with provider. If patient is having issues with cost then provider can ask pharmacy to perform benefits investigation and can advise with drug manufacturer patient can go through for financial assistance. Patient originally felt that she did not need inhaler education and solely wanted assistance with cost. Frederik Schmidt, CPT, ran test claim to determine cost of Trelegy Ellipta is $47 (standard copay for inhalers for Medicare patients). Patient stated that was not affordable. Discussed with pateint she can apply for Trelegy Ellipta patient assistnace program via [place]. Pharmacy team can mail her [place] application and patient can mail back completed application/proof of income documentation to Dr. Loanne Drilling. Also, mentioned that pharmacy team uses In-Check Dial to assess which inhaler is most appropriate for patient and can assist with financial assistance once pharmacy team determines which inhaler is most appropriate. Patient changed her mind and decided she wanted to make a pharmacy appt for inhaler education. She plans on rescheduling PFTs so she can have PFT appt first then pharmacy appt directly afterwards.   Thank you for involving pharmacy to assist in providing this patient's care.   Drexel Iha, PharmD PGY2 Ambulatory Care Pharmacy Resident

## 2019-10-16 ENCOUNTER — Telehealth: Payer: Self-pay | Admitting: Internal Medicine

## 2019-10-16 DIAGNOSIS — F419 Anxiety disorder, unspecified: Secondary | ICD-10-CM

## 2019-10-17 ENCOUNTER — Other Ambulatory Visit: Payer: Self-pay

## 2019-10-17 ENCOUNTER — Encounter: Payer: Self-pay | Admitting: Cardiovascular Disease

## 2019-10-17 ENCOUNTER — Ambulatory Visit (INDEPENDENT_AMBULATORY_CARE_PROVIDER_SITE_OTHER): Payer: Medicare Other | Admitting: Cardiovascular Disease

## 2019-10-17 VITALS — BP 124/76 | HR 117 | Ht 64.0 in

## 2019-10-17 DIAGNOSIS — I10 Essential (primary) hypertension: Secondary | ICD-10-CM | POA: Diagnosis not present

## 2019-10-17 DIAGNOSIS — I4819 Other persistent atrial fibrillation: Secondary | ICD-10-CM | POA: Diagnosis not present

## 2019-10-17 DIAGNOSIS — Z720 Tobacco use: Secondary | ICD-10-CM | POA: Diagnosis not present

## 2019-10-17 DIAGNOSIS — I422 Other hypertrophic cardiomyopathy: Secondary | ICD-10-CM | POA: Diagnosis not present

## 2019-10-17 DIAGNOSIS — R5382 Chronic fatigue, unspecified: Secondary | ICD-10-CM | POA: Diagnosis not present

## 2019-10-17 DIAGNOSIS — I5032 Chronic diastolic (congestive) heart failure: Secondary | ICD-10-CM | POA: Diagnosis not present

## 2019-10-17 MED ORDER — METOPROLOL TARTRATE 100 MG PO TABS: 200.0000 mg | ORAL_TABLET | Freq: Two times a day (BID) | ORAL | 3 refills | Status: DC

## 2019-10-17 NOTE — Progress Notes (Signed)
Cardiology Office Note:   Date:  10/17/2019  NAME:  Becky Stanley    MRN: [id number] DOB:  [birth year]   PCP:  Colon Branch, MD  Cardiologist:  Evalina Field, MD   Referring MD: Colon Branch, MD   Chief Complaint  Patient presents with  . Atrial Fibrillation   History of Present Illness:   Becky Stanley is a 74 y.o. female with a hx of HCM, diastolic HF, obesity, diabetes, persistent Afib, COPD who presents for follow-up. Was seen in January after moving here and new-onset Afib. Seen 2 weeks ago and volume overloaded. BNP 348. We switched to torsemide. Plan was to diurese her before attempt cardioversion.  She reports she is doing better since starting torsemide.  EKG today still shows atrial fibrillation with rate 117.  She reports her heart rates are in the 80s to low 100s at home.  She has been having issues getting Eliquis.  She is going to talk with pharmacy today about that.  She was recently evaluated by pulmonary who have changed her inhalers.  She remains on Eliquis but has had issues with cost.  Despite this she is been able to get it in uninterrupted anticoagulation has been pursued.  She does report that her shortness of breath is a bit better.  She still is not that active.  She uses a scooter.  I have inquired about sleep apnea.  She does report that she snores and does not sleep well.  Based on her body habitus I suspect she does have sleep apnea.  She continues to work on her diabetes.  Problem List 1. 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 HTN 3. Diastolic heart failure  -EF 50-55% -G2DD 4. Obesity 5.Diabetes (A1c 7.8) 6. Atrial fibrillation, persistent  -diagnosed 07/30/2019 -apical variant HCM with apical aneurysm needs AC 7. Tobacco abuse  -e cigarettes currently  -35+ years smoking  8. COPD  Past Medical History: Past Medical History:  Diagnosis Date  . Anxiety and depression   . Carpal tunnel syndrome, left     . Chronic UTI, h/o   . COPD (chronic obstructive pulmonary disease) ([place])   . Diabetes mellitus ([place])   . DJD (degenerative joint disease)   . Essential hypertension   . GERD (gastroesophageal reflux disease)   . Hyperlipidemia   . Hypertrophic cardiomyopathy ([place])   . Iron deficiency anemia   . Vitamin D deficiency     Past Surgical History: Past Surgical History:  Procedure Laterality Date  . CHOLECYSTECTOMY    . ESOPHAGEAL DILATION     multiple procedures per Pt  . TOTAL HIP ARTHROPLASTY Bilateral     Current Medications: Current Meds  Medication Sig  . albuterol (ACCUNEB) 0.63 MG/3ML nebulizer solution Take 3 mLs (0.63 mg total) by nebulization 4 (four) times daily as needed for wheezing or shortness of breath.  Marland Kitchen albuterol (VENTOLIN HFA) 108 (90 Base) MCG/ACT inhaler Inhale 2 puffs into the lungs every 6 (six) hours as needed for wheezing or shortness of breath.  Marland Kitchen apixaban (ELIQUIS) 5 MG TABS tablet Take 1 tablet (5 mg total) by mouth 2 (two) times daily.  . clonazePAM (KLONOPIN) 1 MG tablet TAKE 1 TABLET (1 MG TOTAL) BY MOUTH 2 (TWO) TIMES DAILY AS NEEDED FOR ANXIETY.  . ergocalciferol (DRISDOL) 1.25 MG (50000 UT) capsule Take 1 capsule by mouth once a week.  . esomeprazole (NEXIUM) 20 MG capsule Take 20 mg by  mouth 2 (two) times daily before a meal.  . Fluticasone-Umeclidin-Vilant (TRELEGY ELLIPTA) 200-62.5-25 MCG/INH AEPB Inhale 1 puff into the lungs daily.  Marland Kitchen glimepiride (AMARYL) 4 MG tablet TAKE 1/2 TABLET EVERY MORNING AND 1 TABLET BY MOUTH AT NIGHT  . glucose blood (ONETOUCH VERIO) test strip Check blood sugar once daily  . HYDROcodone-acetaminophen (NORCO/VICODIN) 5-325 MG tablet Take 1 tablet by mouth every 6 (six) hours as needed for moderate pain.  Marland Kitchen levofloxacin (LEVAQUIN) 250 MG tablet Take 250 mg by mouth daily.  Marland Kitchen lovastatin (MEVACOR) 40 MG tablet TAKE 1 TABLET BY MOUTH EVERYDAY AT BEDTIME  . metFORMIN (GLUCOPHAGE-XR) 500 MG 24 hr tablet TAKE 2 TABLETS BY  MOUTH TWICE A DAY  . metoprolol tartrate (LOPRESSOR) 100 MG tablet Take 2 tablets (200 mg total) by mouth 2 (two) times daily.  . mometasone-formoterol (DULERA) 200-5 MCG/ACT AERO Inhale 2 puffs into the lungs 2 (two) times daily.  . mupirocin ointment (BACTROBAN) 2 % Apply 1 application topically 2 (two) times daily as needed.  [name] MISC Check blood sugars once daily  . sertraline (ZOLOFT) 100 MG tablet TAKE 1 AND 1/2 TABLETS BY MOUTH EVERY DAY  . torsemide (DEMADEX) 20 MG tablet Take 1 tablet (20 mg total) by mouth daily.  . [DISCONTINUED] metoprolol tartrate (LOPRESSOR) 100 MG tablet Take 1 tablet (100 mg total) by mouth 2 (two) times daily.     Allergies:    Aminoglycosides, Ciprofloxacin, Erythromycin, Nitrofurantoin, Other, and Penicillins   Social History: Social History   Socioeconomic History  . Marital status: Widowed    Spouse name: Dominica Severin  . Number of children: 2  . Years of education: Not on file  . Highest education level: Not on file  Occupational History  . Occupation: retired  Tobacco Use  . Smoking status: Former Smoker    Packs/day: 1.00    Years: 35.00    Pack years: 35.00    Types: E-cigarettes    Start date: 07/12/1958    Quit date: 09/10/1998    Years since quitting: 21.1  . Smokeless tobacco: Never Used  . Tobacco comment: quit 20 years, started back about 5 years ago, vaping  Substance and Sexual Activity  . Alcohol use: Never    Comment: h/o ETOH abuse   . Drug use: Never  . Sexual activity: Not on file  Other Topics Concern  . Not on file  Social History Narrative   Moved from [place] to the [place] area home 10/12/2018; lives at her daughter.   Lost husband Dominica Severin 06/2019   Former smoker, as of 10-2018 vaping with plans to quit   Social Determinants of Health   Financial Resource Strain:   . Difficulty of Paying Living Expenses:   Food Insecurity:   . Worried About Charity fundraiser in the Last Year:   . Arboriculturist  in the Last Year:   Transportation Needs:   . Film/video editor (Medical):   Marland Kitchen Lack of Transportation (Non-Medical):   Physical Activity:   . Days of Exercise per Week:   . Minutes of Exercise per Session:   Stress:   . Feeling of Stress :   Social Connections:   . Frequency of Communication with Friends and Family:   . Frequency of Social Gatherings with Friends and Family:   . Attends Religious Services:   . Active Member of Clubs or Organizations:   . Attends Archivist Meetings:   Marland Kitchen Marital Status:  Family History: The patient's family history includes CAD in her paternal grandfather; COPD in her father; Colon cancer (age of onset: 3) in her father; Multiple sclerosis in her mother. There is no history of Breast cancer.  ROS:   All other ROS reviewed and negative. Pertinent positives noted in the HPI.     EKGs/Labs/Other Studies Reviewed:   The following studies were personally reviewed by me today:  EKG:  EKG is ordered today.  The ekg ordered today demonstrates atrial fibrillation with heart rate 117, and was personally reviewed by me.   TTE 06/29/2019 1. Left ventricular ejection fraction, by visual estimation, is 50 to  55%. The left ventricle has normal function. There is mildly increased  left ventricular hypertrophy.  2. Elevated left atrial pressure.  3. Left ventricular diastolic parameters are consistent with Grade II  diastolic dysfunction (pseudonormalization).  4. The left ventricle demonstrates regional wall motion abnormalities.  5. Global right ventricle has normal systolic function.The right  ventricular size is normal.  6. Left atrial size was mildly dilated.  7. Right atrial size was normal.  8. Mild mitral annular calcification.  9. The mitral valve is normal in structure. No evidence of mitral valve  regurgitation. No evidence of mitral stenosis.  10. The tricuspid valve is normal in structure. Tricuspid valve    regurgitation is trivial.  11. The aortic valve has an indeterminant number of cusps. Aortic valve  regurgitation is not visualized. No evidence of aortic valve sclerosis or  stenosis.  12. The pulmonic valve was not well visualized. Pulmonic valve  regurgitation is not visualized.  13. The inferior vena cava is normal in size with greater than 50%  respiratory variability, suggesting right atrial pressure of 3 mmHg.  14. Extremely limited due to poor sound wave transmission; apical akinesis  with turbulence in mid LVOT and apex; overall low normal LV systolic  function; grade 2 diastolic dysfunction; mild LAE; suggest cardiac MRI to  better assess LV function and LV  apex.   Recent Labs: 06/15/2019: ALT 39; TSH 1.81 07/27/2019: Hemoglobin 13.7; Platelets 294 09/28/2019: BNP 348.7; BUN 14; Creatinine, Ser 0.80; Potassium 4.6; Sodium 142   Recent Lipid Panel    Component Value Date/Time   CHOL 141 03/01/2019 1358   TRIG 134.0 03/01/2019 1358   HDL 40.00 03/01/2019 1358   CHOLHDL 4 03/01/2019 1358   VLDL 26.8 03/01/2019 1358   LDLCALC 74 03/01/2019 1358    Physical Exam:   VS:  BP 124/76   Pulse (!) 117   Ht 5\' 4"  (1.626 m)    Wt Readings from Last 3 Encounters:  No data found for Wt    General: Obese female, no acute distress Heart: Atraumatic, normal size  Eyes: PEERLA, EOMI  Neck: Supple, no JVD Endocrine: No thryomegaly Cardiac: Irregular rhythm, no murmurs rubs or gallops Lungs: Clear to auscultation bilaterally, no wheezing, rhonchi or rales  Abd: Soft, nontender, no hepatomegaly  Ext: 1+ pitting edema + Musculoskeletal: No deformities, BUE and BLE strength normal and equal Skin: Warm and dry, no rashes   Neuro: Alert and oriented to person, place, time, and situation, CNII-XII grossly intact, no focal deficits  Psych: Normal mood and affect   ASSESSMENT:   Michille A Subia is a 74 y.o. female who presents for the following: 1. Persistent atrial fibrillation  ([place])   2. Chronic heart failure with preserved ejection fraction ([place])   3. Essential hypertension   4. Tobacco abuse   5. Chronic  fatigue     PLAN:   1. Persistent atrial fibrillation (HCC) -Anticoagulation is recommended in the setting of hypertrophic cardiomyopathy.  She does have apical variant per prior reports.  Also likely an apical aneurysm. -We will attempt to get her some assistance with Xarelto.  In the meantime she will remain on Eliquis. -Her rates are not adequately controlled.  We will increase her metoprolol to 200 mg of tartrate twice daily. -I discussed with her that I would recommend cardioversion.  We got a good amount of fluid off her but she reports that given all these new pulmonary medication she would like to hold on this.  I recommended amiodarone to help maintain sinus rhythm.  I think she will need this given her hypertrophic cardiomyopathy, we have limited options for rhythm control.  We will plan to see her back in 1 month for a virtual visit and likely get her on amiodarone is scheduled for a cardioversion.  She will remain on uninterrupted anticoagulation the meantime.  2. Chronic heart failure with preserved ejection fraction (HCC) -Fluid much improved on torsemide.  Still has a bit today.  We will increase to 40 mg/day for 3 days and then resume 20 mg daily.  I counseled her extensively on salt reduction strategies.  She does eat out a lot and I think this is a source of salt and fluid overload.  3. Essential hypertension -Well-controlled today.  4. Tobacco abuse -Still smoking and counseled on cessation.  5. Chronic fatigue -I recommended home sleep study.  I suspect she likely has untreated sleep apnea this triggered her atrial fibrillation.  We will try to get this done before we attempt cardioversion.  Disposition: Return in about 1 month (around 11/16/2019).  Medication Adjustments/Labs and Tests Ordered: Current medicines are reviewed at length with  the patient today.  Concerns regarding medicines are outlined above.  Orders Placed This Encounter  Procedures  . EKG 12-Lead  . Home sleep test   Meds ordered this encounter  Medications  . metoprolol tartrate (LOPRESSOR) 100 MG tablet    Sig: Take 2 tablets (200 mg total) by mouth 2 (two) times daily.    Dispense:  180 tablet    Refill:  3    Patient Instructions  Medication Instructions:  Increase Metoprolol to 200 in the morning, and 200 in the evening  Increase Torsemide 40 mg for 3 days, then back to 20 mg daily   *If you need a refill on your cardiac medications before your next appointment, please call your pharmacy*   Testing/Procedures: Your physician has recommended that you have a sleep study. This test records several body functions during sleep, including: brain activity, eye movement, oxygen and carbon dioxide blood levels, heart rate and rhythm, breathing rate and rhythm, the flow of air through your mouth and nose, snoring, body muscle movements, and chest and belly movement.   Follow-Up: At [facility], you and your health needs are our priority.  As part of our continuing mission to provide you with exceptional heart care, we have created designated Provider Care Teams.  These Care Teams include your primary Cardiologist (physician) and Advanced Practice Providers (APPs -  Physician Assistants and Nurse Practitioners) who all work together to provide you with the care you need, when you need it.  We recommend signing up for the patient portal called "MyChart".  Sign up information is provided on this After Visit Summary.  MyChart is used to connect with patients for Virtual Visits (  Telemedicine).  Patients are able to view lab/test results, encounter notes, upcoming appointments, etc.  Non-urgent messages can be sent to your provider as well.   To learn more about what you can do with MyChart, go to [email or web address].    Your next appointment:   The week  of May 3rd  The format for your next appointment:   Virtual Visit   Provider:   Eleonore Chiquito, MD        Time Spent with Patient: I have spent a total of 35 minutes with patient reviewing hospital notes, telemetry, EKGs, labs and examining the patient as well as establishing an assessment and plan that was discussed with the patient.  > 50% of time was spent in direct patient care.  Signed, Addison Naegeli. Audie Box, [place]  [address], [place], [place] [address] [phone]  10/17/2019 3:37 PM

## 2019-10-17 NOTE — Telephone Encounter (Signed)
PDMP okay, Rx sent 

## 2019-10-17 NOTE — Patient Instructions (Signed)
Medication Instructions:  Increase Metoprolol to 200 in the morning, and 200 in the evening  Increase Torsemide 40 mg for 3 days, then back to 20 mg daily   *If you need a refill on your cardiac medications before your next appointment, please call your pharmacy*   Testing/Procedures: Your physician has recommended that you have a sleep study. This test records several body functions during sleep, including: brain activity, eye movement, oxygen and carbon dioxide blood levels, heart rate and rhythm, breathing rate and rhythm, the flow of air through your mouth and nose, snoring, body muscle movements, and chest and belly movement.   Follow-Up: At [facility], you and your health needs are our priority.  As part of our continuing mission to provide you with exceptional heart care, we have created designated Provider Care Teams.  These Care Teams include your primary Cardiologist (physician) and Advanced Practice Providers (APPs -  Physician Assistants and Nurse Practitioners) who all work together to provide you with the care you need, when you need it.  We recommend signing up for the patient portal called "MyChart".  Sign up information is provided on this After Visit Summary.  MyChart is used to connect with patients for Virtual Visits (Telemedicine).  Patients are able to view lab/test results, encounter notes, upcoming appointments, etc.  Non-urgent messages can be sent to your provider as well.   To learn more about what you can do with MyChart, go to [email or web address].    Your next appointment:   The week of May 3rd  The format for your next appointment:   Virtual Visit   Provider:   Eleonore Chiquito, MD

## 2019-10-17 NOTE — Telephone Encounter (Signed)
Clonazepam refill.   Last OV: 09/06/2019 Last Fill: 08/27/2019 #60 and 0RF Pt sig: 1 tab bid prn UDS: None  Needs UDS and contract at next OV

## 2019-10-22 ENCOUNTER — Encounter: Payer: Self-pay | Admitting: *Deleted

## 2019-10-22 ENCOUNTER — Other Ambulatory Visit (HOSPITAL_COMMUNITY): Payer: Medicare Other

## 2019-10-22 ENCOUNTER — Telehealth: Payer: Self-pay | Admitting: *Deleted

## 2019-10-22 ENCOUNTER — Telehealth: Payer: Self-pay | Admitting: Pulmonary Disease

## 2019-10-22 DIAGNOSIS — I10 Essential (primary) hypertension: Secondary | ICD-10-CM

## 2019-10-22 NOTE — Telephone Encounter (Signed)
-----   Message from Freada Bergeron, [place] sent at 10/22/2019  1:29 PM EDT -----  ----- Message ----- From: Carron Curie Sent: 10/17/2019   3:43 PM EDT To: Cv Div Sleep Studies  Ordered by Dr. Audie Box

## 2019-10-22 NOTE — Telephone Encounter (Signed)
-----   Message from Freada Bergeron, [place] sent at 10/22/2019  4:27 PM EDT -----  ----- Message ----- From: Carron Curie Sent: 10/17/2019   3:43 PM EDT To: Cv Div Sleep Studies  Ordered by Dr. Audie Box

## 2019-10-22 NOTE — Telephone Encounter (Signed)
Spoke with pt, I advised her to make an appt with the pharmacy team to help get assistance with Trelegy. She made an appt on 10/30/2019 at 1:20pm. Nothing further is needed.

## 2019-10-22 NOTE — Telephone Encounter (Signed)
Patient is aware and agreeable to Home Sleep Study through [facility]. Patient is scheduled for 01/04/20 at 10:00 to pick up home sleep kit and meet with Respiratory therapist at [facility]. Patient is aware that if this appointment date and time does not work for them they should contact Artis Delay directly at [phone]. Patient is aware that a sleep packet will be sent from [facility] in week. Patient is agreeable to treatment and thankful for call.

## 2019-10-22 NOTE — Telephone Encounter (Signed)
This encounter was created in error - please disregard.

## 2019-10-24 NOTE — Progress Notes (Signed)
HPI Patient presents today to Becky Stanley to see pharmacy team for inhaler education and optimization.  Past medical history includes COPD, history of tobacco abuse, A. fib, CHF, hypertension, type 2 diabetes, GERD, morbid obesity, anxiety and depression.  Last visit with Dr. Loanne Stanley she was placed on Trelegy Ellipta inhaler.  Patient has noticed an improvement in her some since starting Trelegy.  Patient states she is unable to afford the $47 co-pay for her Trelegy inhaler.  She has a $400 deductible to meet.  Patient needs to be on triple therapy per Dr. Loanne Stanley.  Respiratory Medications Current: Trelegy Tried in past: Dulera, albuterol Patient reports adherence challenges  OBJECTIVE Allergies  Allergen Reactions  . Aminoglycosides   . Ciprofloxacin Nausea And Vomiting    Tolerates levaquin  . Erythromycin Nausea And Vomiting  . Nitrofurantoin Nausea And Vomiting  . Other Nausea And Vomiting    [place]  . Penicillins Hives    Outpatient Encounter Medications as of 10/30/2019  Medication Sig Note  . albuterol (ACCUNEB) 0.63 MG/3ML nebulizer solution Take 3 mLs (0.63 mg total) by nebulization 4 (four) times daily as needed for wheezing or shortness of breath.   Marland Kitchen albuterol (VENTOLIN HFA) 108 (90 Base) MCG/ACT inhaler Inhale 2 puffs into the lungs every 6 (six) hours as needed for wheezing or shortness of breath.   Marland Kitchen apixaban (ELIQUIS) 5 MG TABS tablet Take 1 tablet (5 mg total) by mouth 2 (two) times daily.   . clonazePAM (KLONOPIN) 1 MG tablet TAKE 1 TABLET (1 MG TOTAL) BY MOUTH 2 (TWO) TIMES DAILY AS NEEDED FOR ANXIETY.   . ergocalciferol (DRISDOL) 1.25 MG (50000 UT) capsule Take 1 capsule by mouth once a week.   . esomeprazole (NEXIUM) 20 MG capsule Take 20 mg by mouth 2 (two) times daily before a meal.   . Fluticasone-Umeclidin-Vilant (TRELEGY ELLIPTA) 200-62.5-25 MCG/INH AEPB Inhale 1 puff into the lungs daily.   Marland Kitchen glimepiride (AMARYL) 4 MG tablet TAKE 1/2 TABLET EVERY  MORNING AND 1 TABLET BY MOUTH AT NIGHT   . glucose blood (ONETOUCH VERIO) test strip Check blood sugar once daily   . HYDROcodone-acetaminophen (NORCO/VICODIN) 5-325 MG tablet Take 1 tablet by mouth every 6 (six) hours as needed for moderate pain. 10/25/2018: Rarely takes  . levofloxacin (LEVAQUIN) 250 MG tablet Take 250 mg by mouth daily. 10/25/2018: From Infectious Disease- for chronic UTI  . lovastatin (MEVACOR) 40 MG tablet TAKE 1 TABLET BY MOUTH EVERYDAY AT BEDTIME   . metFORMIN (GLUCOPHAGE-XR) 500 MG 24 hr tablet TAKE 2 TABLETS BY MOUTH TWICE A DAY   . metoprolol tartrate (LOPRESSOR) 100 MG tablet Take 2 tablets (200 mg total) by mouth 2 (two) times daily.   . mometasone-formoterol (DULERA) 200-5 MCG/ACT AERO Inhale 2 puffs into the lungs 2 (two) times daily.   . mupirocin ointment (BACTROBAN) 2 % Apply 1 application topically 2 (two) times daily as needed.   [name] MISC Check blood sugars once daily   . sertraline (ZOLOFT) 100 MG tablet TAKE 1 AND 1/2 TABLETS BY MOUTH EVERY DAY   . torsemide (DEMADEX) 20 MG tablet Take 1 tablet (20 mg total) by mouth daily.    No facility-administered encounter medications on file as of 10/30/2019.     Immunization History  Administered Date(s) Administered  . Influenza, High Dose Seasonal PF 06/20/2019  . Influenza-Unspecified 05/16/2006, 04/17/2009, 06/01/2010  . PFIZER SARS-COV-2 Vaccination 09/09/2019, 10/09/2019  . Pneumococcal-Unspecified 03/30/2007     PFTs No flowsheet  data found.    Assessment   1. Inhaler Optimization  PIFR  . Inspiratory flow measured using the In-check DIAL G16 and was in range of 30-90 for use of medium-low resistance DPI device (Ellipta, Diskus). Optimal range is greater than 60 L/min.  Patient scored 75 after coaching.  Patient is a good candidate for Trelegy Ellipta based off of PIFR but is unable to afford.  Discussed [place] patient assistance eligibility requirements including income and  out-of-pocket spending on prescription medications.  Patient states she has not spent $600 on prescriptions this past year.  She would not qualify for Trelegy Ellipta patient assistance.  Discussed Breztri inhaler as an alternative option which also has a patient assistance program, [place].  Program does not require a specific out-of-pocket spending on prescription medications and she meets the income eligibility requirements.  Patient filled out application in office and brought a copy of her Social Security statement to submit for approval.  We will update patient when we hear response.  Patient was counseled on the purpose, proper use, and adverse effects of Breztri inhaler.  Instructed patient to rinse mouth with water after using in order to prevent fungal infection.  Patient verbalized understanding.  Reviewed appropriate use of maintenance vs rescue inhalers.  Stressed importance of using maintenance inhaler daily and rescue inhaler only as needed.  Patient verbalized understanding.  Demonstrated proper inhaler technique using Breztri demo inhaler.  Patient able to demonstrate proper inhaler technique using teach back method. Patient was given sample in office today.    2. Medication Reconciliation  A drug regimen assessment was performed, including review of allergies, interactions, disease-state management, dosing and immunization history. Medications were reviewed with the patient, including name, instructions, indication, goals of therapy, potential side effects, importance of adherence, and safe use.  Drug interaction(s): no major drug interactions identified  3. Immunizations  Patient is up to date with annual influenza and Covid-19 vaccine.  Recommend Pneumovax-23 and Shingrix vaccine.  PLAN  Start Breztri 2 puffs twice a day  All questions encouraged and answered.  Instructed patient to reach out with any further questions or concerns.  Thank you for allowing pharmacy to  participate in this patient's care.  This appointment required 50 minutes of patient care (this includes precharting, chart review, review of results, face-to-face care, etc.).   Mariella Saa, PharmD, [place], [place] Clinical Specialty Pharmacist [phone]  10/30/2019 3:08 PM

## 2019-10-25 ENCOUNTER — Other Ambulatory Visit: Payer: Medicare Other

## 2019-10-29 ENCOUNTER — Other Ambulatory Visit: Payer: Medicare Other

## 2019-10-30 ENCOUNTER — Other Ambulatory Visit: Payer: Self-pay

## 2019-10-30 ENCOUNTER — Ambulatory Visit (INDEPENDENT_AMBULATORY_CARE_PROVIDER_SITE_OTHER): Payer: Medicare Other | Admitting: Pharmacist

## 2019-10-30 DIAGNOSIS — J449 Chronic obstructive pulmonary disease, unspecified: Secondary | ICD-10-CM | POA: Diagnosis not present

## 2019-10-30 DIAGNOSIS — Z7189 Other specified counseling: Secondary | ICD-10-CM

## 2019-10-30 MED ORDER — BREZTRI AEROSPHERE 160-9-4.8 MCG/ACT IN AERO: 2.0000 | INHALATION_SPRAY | Freq: Two times a day (BID) | RESPIRATORY_TRACT | 0 refills | Status: DC

## 2019-10-30 MED ORDER — BREZTRI AEROSPHERE 160-9-4.8 MCG/ACT IN AERO: 2.0000 | INHALATION_SPRAY | Freq: Two times a day (BID) | RESPIRATORY_TRACT | 11 refills | Status: DC

## 2019-10-30 NOTE — Patient Instructions (Signed)
   START Breztri 2 puffs twice a day   Use with spacer  We will update you when we hear from patient assistance  Call pharmacy team at [phone] with any questions or concerns!

## 2019-11-06 ENCOUNTER — Ambulatory Visit (INDEPENDENT_AMBULATORY_CARE_PROVIDER_SITE_OTHER): Payer: Medicare Other | Admitting: Pulmonary Disease

## 2019-11-06 ENCOUNTER — Other Ambulatory Visit: Payer: Self-pay

## 2019-11-06 DIAGNOSIS — J449 Chronic obstructive pulmonary disease, unspecified: Secondary | ICD-10-CM

## 2019-11-06 NOTE — Progress Notes (Signed)
Virtual Visit via Telephone Note  I connected with Becky Stanley on 11/06/19 at  2:00 PM EDT by telephone and verified that I am speaking with the correct person using two identifiers.  Location: Patient: Home Provider: [facility] office   I discussed the limitations, risks, security and privacy concerns of performing an evaluation and management service by telephone and the availability of in person appointments. I also discussed with the patient that there may be a patient responsible charge related to this service. The patient expressed understanding and agreed to proceed.   I discussed the assessment and treatment plan with the patient. The patient was provided an opportunity to ask questions and all were answered. The patient agreed with the plan and demonstrated an understanding of the instructions.   The patient was advised to call back or seek an in-person evaluation if the symptoms worsen or if the condition fails to improve as anticipated.  I provided 22 minutes of non-face-to-face time during this encounter.   Miyeko Mahlum Rodman Pickle, MD    Subjective:   PATIENT ID: Becky Stanley GENDER: female DOB: 09-18-1945, MRN: [id number]   HPI  Chief Complaint  Patient presents with  . televisit    still using Trelegy, no cough, SOB gotten better    Reason for Visit: Follow-up COPD  Becky Stanley is a 74 year old female with HCM, chronic diastolic heart failure, atrial fibrillation who presents for follow-up.  Synopsis: She was previously followed by Pulmonology in [place]. She was diagnosed with COPD three years ago when she was hospitalized. She is currently on Dulera 200-5 mcg TWO puffs TWICE a day. She notices that medication runs out. She has shortness of breath, wheezing and mixed cough worsened with talking and movement. She has chronic chest congestion. Reports at least a 30 pack-year history and quit 20 years ago however started vaping 5 years ago due to  stressors. She is interested in quitting but acknowledges that she is having difficulty with motivation. Not able to take wellbutrin (caused reflux) and not interested in chantix (heard about hallucinations/agitation)   Interval Since our last visit on 10/08/19, she has been compliant with Trelegy and feels that she has improved her dyspnea significantly. She only has 3-4 days left of her sample. Her wheezing and coughing has also dramatically improved and will only occur rarely at night. She also attributes her symptom relief is related to her heart medications. She still has shortness of breath with walking. She is still vaping. She has not yet completed her sleep test or her pulmonary function tests and is debating if she wants to cancel them as she does not feel she wants to pursue treatment for it.  I have personally reviewed patient's past medical/family/social history/allergies/current medications.  Past Medical History:  Diagnosis Date  . Anxiety and depression   . Carpal tunnel syndrome, left   . Chronic UTI, h/o   . COPD (chronic obstructive pulmonary disease) ([place])   . Diabetes mellitus ([place])   . DJD (degenerative joint disease)   . Essential hypertension   . GERD (gastroesophageal reflux disease)   . Hyperlipidemia   . Hypertrophic cardiomyopathy ([place])   . Iron deficiency anemia   . Vitamin D deficiency      Outpatient Medications Prior to Visit  Medication Sig Dispense Refill  . albuterol (ACCUNEB) 0.63 MG/3ML nebulizer solution Take 3 mLs (0.63 mg total) by nebulization 4 (four) times daily as needed for wheezing or shortness of breath. 75  mL 2  . albuterol (VENTOLIN HFA) 108 (90 Base) MCG/ACT inhaler Inhale 2 puffs into the lungs every 6 (six) hours as needed for wheezing or shortness of breath.    Marland Kitchen alum & mag hydroxide-simeth (MAALOX/MYLANTA) 200-200-20 MG/5ML suspension Take 15 mLs by mouth every 6 (six) hours as needed for indigestion or heartburn.    Marland Kitchen apixaban  (ELIQUIS) 5 MG TABS tablet Take 1 tablet (5 mg total) by mouth 2 (two) times daily. 60 tablet 11  . Budeson-Glycopyrrol-Formoterol (BREZTRI AEROSPHERE) 160-9-4.8 MCG/ACT AERO Inhale 2 puffs into the lungs 2 (two) times daily. 10.7 g 0  . Budeson-Glycopyrrol-Formoterol (BREZTRI AEROSPHERE) 160-9-4.8 MCG/ACT AERO Inhale 2 puffs into the lungs 2 (two) times daily. 10.7 g 11  . clonazePAM (KLONOPIN) 1 MG tablet TAKE 1 TABLET (1 MG TOTAL) BY MOUTH 2 (TWO) TIMES DAILY AS NEEDED FOR ANXIETY. 60 tablet 2  . esomeprazole (NEXIUM) 20 MG capsule Take 20 mg by mouth 2 (two) times daily before a meal.    . glimepiride (AMARYL) 4 MG tablet TAKE 1/2 TABLET EVERY MORNING AND 1 TABLET BY MOUTH AT NIGHT 135 tablet 1  . glucose blood (ONETOUCH VERIO) test strip Check blood sugar once daily 100 each 12  . HYDROcodone-acetaminophen (NORCO/VICODIN) 5-325 MG tablet Take 1 tablet by mouth every 6 (six) hours as needed for moderate pain.    Marland Kitchen levofloxacin (LEVAQUIN) 250 MG tablet Take 250 mg by mouth daily as needed.     . lovastatin (MEVACOR) 40 MG tablet TAKE 1 TABLET BY MOUTH EVERYDAY AT BEDTIME 90 tablet 1  . metFORMIN (GLUCOPHAGE-XR) 500 MG 24 hr tablet TAKE 2 TABLETS BY MOUTH TWICE A DAY 360 tablet 1  . metoprolol tartrate (LOPRESSOR) 100 MG tablet Take 2 tablets (200 mg total) by mouth 2 (two) times daily. 180 tablet 3  . mupirocin ointment (BACTROBAN) 2 % Apply 1 application topically 2 (two) times daily as needed. 22 g 0  . OneTouch Delica Lancets 99991111 MISC Check blood sugars once daily 100 each 12  . Probiotic Product (PROBIOTIC-10 PO) Take 1 capsule by mouth daily.    . sertraline (ZOLOFT) 100 MG tablet TAKE 1 AND 1/2 TABLETS BY MOUTH EVERY DAY 135 tablet 2  . torsemide (DEMADEX) 20 MG tablet Take 1 tablet (20 mg total) by mouth daily. 180 tablet 3   No facility-administered medications prior to visit.    Review of Systems  Constitutional: Negative for chills, diaphoresis, fever, malaise/fatigue and weight  loss.  HENT: Negative for congestion.   Respiratory: Positive for shortness of breath. Negative for cough, hemoptysis, sputum production and wheezing.   Cardiovascular: Negative for chest pain, palpitations and leg swelling.   Objective:   There were no vitals filed for this visit.    Physical Exam: No apparent distress on the phone  Data Reviewed:  Imaging: CXR 06/15/2019 - No infiltrate, effusion or edema. Minimal lingula atelectasis  PFT: None on file     Assessment & Plan:   Discussion: 74 year old female active smoker with COPD of unknown severity with chronic bronchitis, HCM, chronic diastolic heart failure and atrial fibrillation who presents with follow-up. Tolerating triple therapy inhaler with good response however due to cost, she will need to switch from Trelegy to [place].   She likely has COPD based on smoking history and symptoms and would benefit from the addition of a LAMA to her bronchodilator regimen. We also discussed smoking cessation. She prefers nicotine patches for now as she states an intolerance to  gum/lozenges and medication aids.  COPD --Obtain Trelegy with Pulmonary Pharmacy assistance  Tobacco abuse Patient is an active smoker. We discussed smoking cessation for 3 minutes. We discussed triggers and stressors and ways to deal with them. We discussed barriers to continued smoking and benefits of smoking cessation. Provided patient with information cessation techniques and interventions including [place] quitline.   Health Maintenance Immunization History  Administered Date(s) Administered  . Influenza, High Dose Seasonal PF 06/20/2019  . Influenza-Unspecified 05/16/2006, 04/17/2009, 06/01/2010  . PFIZER SARS-COV-2 Vaccination 09/09/2019, 10/09/2019  . Pneumococcal-Unspecified 03/30/2007   CT Lung Screen - will discuss at next visit  No orders of the defined types were placed in this encounter.  No orders of the defined types were placed in this  encounter.   No follow-ups on file.  [place], MD [facility] 11/06/2019 10:39 AM  Office Number [phone]

## 2019-11-15 NOTE — Progress Notes (Signed)
Virtual Visit via Telephone Note   This visit type was conducted due to national recommendations for restrictions regarding the COVID-19 Pandemic (e.g. social distancing) in an effort to limit this patient's exposure and mitigate transmission in our community.  Due to her co-morbid illnesses, this patient is at least at moderate risk for complications without adequate follow up.  This format is felt to be most appropriate for this patient at this time.  The patient did not have access to video technology/had technical difficulties with video requiring transitioning to audio format only (telephone).  All issues noted in this document were discussed and addressed.  No physical exam could be performed with this format.  Please refer to the patient's chart for her  consent to telehealth for [facility].   The patient was identified using 2 identifiers.  Date:  11/16/2019   ID:  Becky Stanley, DOB 05/31/1946, MRN [id number]  Patient Location: Home Provider Location: Office  PCP:  Colon Branch, MD  Cardiologist:  Evalina Field, MD   Evaluation Performed:  Follow-Up Visit  Chief Complaint:  Atrial fibrillation  History of Present Illness:    Becky Stanley is a 74 y.o. female with apical variant hypertrophic cardiomyopathy, hypertension, diastolic heart failure, diabetes, persistent atrial fibrillation who presents for follow-up.  She was last seen 1 month ago and we increased her torsemide to 40 mg/day for 3 days and then to resume 20 mg daily.  She noted significant improvement in her breathing with this.  She reports she does watch what she is eating and is able to titrate her diuretic with her symptoms.  She was also evaluated by pulmonology who have prescribed new inhalers.  She is also noticed considerable improvement with this.  She also reports since increasing her metoprolol her heart rates have been in the 80-90 range.  Heart rate is 124 today but she did not take her medication yet.   She reports that Eliquis will be cost prohibitive for her.  I asked her to look into Xarelto at her last visit but she has not done this yet.  She will do this.  She reports that after this month she will no longer be able to take Eliquis.  If Xarelto is cost prohibitive she would be recommended to take Coumadin.  She tells me that Coumadin is not an option for her and she will refuse that.  This is her decision and I did inform her of her stroke risk being high.  At the very least she should take a baby aspirin and we can talk about this when she lets me know how she would like to proceed with anticoagulation.  Clearly I did discuss with her that attempts at cardioversion either question without anticoagulation.  She does realize this.  For now she will be limited to rate control strategy.  She denies chest pain, shortness of breath, palpitations today.  Overall seems to be much improved since her last visit.  Problem List 1. 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 HTN 3.Diastolic heart failure -EF 50-55% -G2DD 4. Obesity 5.Diabetes (A1c 7.8) 6. Atrial fibrillation, persistent -diagnosed 07/30/2019 -apical variant HCM with apical aneurysm needs AC 7. Tobacco abuse -e cigarettes currently  -35+ years smoking  8. COPD  The patient does not have symptoms concerning for COVID-19 infection (fever, chills, cough, or new shortness of breath).    Past Medical History:  Diagnosis Date  . Anxiety  and depression   . Carpal tunnel syndrome, left   . Chronic UTI, h/o   . COPD (chronic obstructive pulmonary disease) ([place])   . Diabetes mellitus ([place])   . DJD (degenerative joint disease)   . Essential hypertension   . GERD (gastroesophageal reflux disease)   . Hyperlipidemia   . Hypertrophic cardiomyopathy ([place])   . Iron deficiency anemia   . Vitamin D deficiency    Past Surgical History:  Procedure Laterality Date  . CHOLECYSTECTOMY    .  ESOPHAGEAL DILATION     multiple procedures per Pt  . TOTAL HIP ARTHROPLASTY Bilateral      Current Meds  Medication Sig  . albuterol (ACCUNEB) 0.63 MG/3ML nebulizer solution Take 3 mLs (0.63 mg total) by nebulization 4 (four) times daily as needed for wheezing or shortness of breath.  Marland Kitchen albuterol (VENTOLIN HFA) 108 (90 Base) MCG/ACT inhaler Inhale 2 puffs into the lungs every 6 (six) hours as needed for wheezing or shortness of breath.  Marland Kitchen alum & mag hydroxide-simeth (MAALOX/MYLANTA) 200-200-20 MG/5ML suspension Take 15 mLs by mouth every 6 (six) hours as needed for indigestion or heartburn.  Marland Kitchen apixaban (ELIQUIS) 5 MG TABS tablet Take 1 tablet (5 mg total) by mouth 2 (two) times daily.  . Budeson-Glycopyrrol-Formoterol (BREZTRI AEROSPHERE) 160-9-4.8 MCG/ACT AERO Inhale 2 puffs into the lungs 2 (two) times daily.  . clonazePAM (KLONOPIN) 1 MG tablet TAKE 1 TABLET (1 MG TOTAL) BY MOUTH 2 (TWO) TIMES DAILY AS NEEDED FOR ANXIETY.  Marland Kitchen esomeprazole (NEXIUM) 20 MG capsule Take 20 mg by mouth 2 (two) times daily before a meal.  . glimepiride (AMARYL) 4 MG tablet TAKE 1/2 TABLET EVERY MORNING AND 1 TABLET BY MOUTH AT NIGHT  . glucose blood (ONETOUCH VERIO) test strip Check blood sugar once daily  . HYDROcodone-acetaminophen (NORCO/VICODIN) 5-325 MG tablet Take 1 tablet by mouth every 6 (six) hours as needed for moderate pain.  Marland Kitchen levofloxacin (LEVAQUIN) 250 MG tablet Take 250 mg by mouth daily as needed.   . lovastatin (MEVACOR) 40 MG tablet TAKE 1 TABLET BY MOUTH EVERYDAY AT BEDTIME  . metFORMIN (GLUCOPHAGE-XR) 500 MG 24 hr tablet TAKE 2 TABLETS BY MOUTH TWICE A DAY  . metoprolol tartrate (LOPRESSOR) 100 MG tablet Take 2 tablets (200 mg total) by mouth 2 (two) times daily.  . mupirocin ointment (BACTROBAN) 2 % Apply 1 application topically 2 (two) times daily as needed.  [name] MISC Check blood sugars once daily  . Probiotic Product (PROBIOTIC-10 PO) Take 1 capsule by mouth daily.   . sertraline (ZOLOFT) 100 MG tablet TAKE 1 AND 1/2 TABLETS BY MOUTH EVERY DAY  . torsemide (DEMADEX) 20 MG tablet Take 1 tablet (20 mg total) by mouth daily.  . [DISCONTINUED] metoprolol tartrate (LOPRESSOR) 100 MG tablet Take 2 tablets (200 mg total) by mouth 2 (two) times daily.  . [DISCONTINUED] torsemide (DEMADEX) 20 MG tablet Take 1 tablet (20 mg total) by mouth daily.     Allergies:   Aminoglycosides, Ciprofloxacin, Erythromycin, Nitrofurantoin, Other, and Penicillins   Social History   Tobacco Use  . Smoking status: Former Smoker    Packs/day: 1.00    Years: 35.00    Pack years: 35.00    Types: E-cigarettes    Start date: 07/12/1958    Quit date: 09/10/1998    Years since quitting: 21.1  . Smokeless tobacco: Never Used  . Tobacco comment: quit 20 years, started back about 5 years ago, vaping  Substance Use  Topics  . Alcohol use: Never    Comment: h/o ETOH abuse   . Drug use: Never     Family Hx: The patient's family history includes CAD in her paternal grandfather; COPD in her father; Colon cancer (age of onset: 9) in her father; Multiple sclerosis in her mother. There is no history of Breast cancer.  ROS:   Please see the history of present illness.     All other systems reviewed and are negative.   Prior CV studies:   The following studies were reviewed today:  TTE 06/29/2019 1. Left ventricular ejection fraction, by visual estimation, is 50 to  55%. The left ventricle has normal function. There is mildly increased  left ventricular hypertrophy.  2. Elevated left atrial pressure.  3. Left ventricular diastolic parameters are consistent with Grade II  diastolic dysfunction (pseudonormalization).  4. The left ventricle demonstrates regional wall motion abnormalities.  5. Global right ventricle has normal systolic function.The right  ventricular size is normal.  6. Left atrial size was mildly dilated.  7. Right atrial size was normal.  8. Mild mitral  annular calcification.  9. The mitral valve is normal in structure. No evidence of mitral valve  regurgitation. No evidence of mitral stenosis.  10. The tricuspid valve is normal in structure. Tricuspid valve  regurgitation is trivial.  11. The aortic valve has an indeterminant number of cusps. Aortic valve  regurgitation is not visualized. No evidence of aortic valve sclerosis or  stenosis.  12. The pulmonic valve was not well visualized. Pulmonic valve  regurgitation is not visualized.  13. The inferior vena cava is normal in size with greater than 50%  respiratory variability, suggesting right atrial pressure of 3 mmHg.  14. Extremely limited due to poor sound wave transmission; apical akinesis  with turbulence in mid LVOT and apex; overall low normal LV systolic  function; grade 2 diastolic dysfunction; mild LAE; suggest cardiac MRI to  better assess LV function and LV  apex.   Labs/Other Tests and Data Reviewed:    EKG:  No ECG reviewed.  Recent Labs: 06/15/2019: ALT 39; TSH 1.81 07/27/2019: Hemoglobin 13.7; Platelets 294 09/28/2019: BNP 348.7; BUN 14; Creatinine, Ser 0.80; Potassium 4.6; Sodium 142   Recent Lipid Panel Lab Results  Component Value Date/Time   CHOL 141 03/01/2019 01:58 PM   TRIG 134.0 03/01/2019 01:58 PM   HDL 40.00 03/01/2019 01:58 PM   CHOLHDL 4 03/01/2019 01:58 PM   LDLCALC 74 03/01/2019 01:58 PM    Wt Readings from Last 3 Encounters:  No data found for Wt     Objective:    Vital Signs:  Pulse (!) 124   Ht 5\' 4"  (1.626 m)   SpO2 95%    VITAL SIGNS:  reviewed  Gen: NAD Pulm: No SOB by phone Psych: normal mood/affect   ASSESSMENT & PLAN:   1. Persistent atrial fibrillation (HCC) -Anticoagulation is recommended in the setting of apical variant hypertrophic cardiomyopathy independent of chads vas score.  It appears that Eliquis is prohibitive due to cost.  She will can Xarelto.  She informed me that she would not like to do Coumadin if both  agents are out of the question for her.  I did have a lengthy discussion with her that she is at considerable stroke risk given her heart condition as well as stroke risk factors.  She does understand this but reports that Coumadin will just not be an option for her. -For now we will continue  with rate control.  Metoprolol tartrate 200 mg twice a day. -We will hold on any attempts at cardioversion as anticoagulation is an issue here.  2. Chronic heart failure with preserved ejection fraction (HCC)/Apical Variant HCM -Continue torsemide 20 mg daily.  She does increase her dose to 40 mg periodically if she notices increase shortness of breath and lower extremity edema.  I am okay with this.  Kidney function normal on last check.    3. Essential hypertension -No change in medication  4. Tobacco abuse -Still smoking and counseled on cessation.  5. Chronic fatigue -I have recommended a sleep study.  Apparently cost is an issue here.  She will let us know if she can get this done.  COVID-19 Education: The signs and symptoms of COVID-19 were discussed with the patient and how to seek care for testing (follow up with PCP or arrange E-visit).  The importance of social distancing was discussed today.  Time:  Today, I have spent 25 minutes with the patient with telehealth technology discussing the above problems.     Medication Adjustments/Labs and Tests Ordered: Current medicines are reviewed at length with the patient today.  Concerns regarding medicines are outlined above.   Tests Ordered: No orders of the defined types were placed in this encounter.   Medication Changes: Meds ordered this encounter  Medications  . torsemide (DEMADEX) 20 MG tablet    Sig: Take 1 tablet (20 mg total) by mouth daily.    Dispense:  180 tablet    Refill:  3  . metoprolol tartrate (LOPRESSOR) 100 MG tablet    Sig: Take 2 tablets (200 mg total) by mouth 2 (two) times daily.    Dispense:  180 tablet     Refill:  3    Follow Up:  In Person in 3 month(s)  Signed, Evalina Field, MD  11/16/2019 10:31 AM    [place]

## 2019-11-16 ENCOUNTER — Telehealth (INDEPENDENT_AMBULATORY_CARE_PROVIDER_SITE_OTHER): Payer: Medicare Other | Admitting: Cardiovascular Disease

## 2019-11-16 ENCOUNTER — Encounter: Payer: Self-pay | Admitting: Cardiovascular Disease

## 2019-11-16 ENCOUNTER — Telehealth: Payer: Self-pay | Admitting: Pharmacy Technician

## 2019-11-16 VITALS — HR 124 | Ht 64.0 in

## 2019-11-16 DIAGNOSIS — I5032 Chronic diastolic (congestive) heart failure: Secondary | ICD-10-CM

## 2019-11-16 DIAGNOSIS — I10 Essential (primary) hypertension: Secondary | ICD-10-CM | POA: Diagnosis not present

## 2019-11-16 DIAGNOSIS — I422 Other hypertrophic cardiomyopathy: Secondary | ICD-10-CM | POA: Diagnosis not present

## 2019-11-16 DIAGNOSIS — Z720 Tobacco use: Secondary | ICD-10-CM

## 2019-11-16 DIAGNOSIS — I4819 Other persistent atrial fibrillation: Secondary | ICD-10-CM

## 2019-11-16 MED ORDER — METOPROLOL TARTRATE 100 MG PO TABS: 200.0000 mg | ORAL_TABLET | Freq: Two times a day (BID) | ORAL | 3 refills | Status: DC

## 2019-11-16 MED ORDER — TORSEMIDE 20 MG PO TABS: 20.0000 mg | ORAL_TABLET | Freq: Every day | ORAL | 3 refills | Status: DC

## 2019-11-16 NOTE — Telephone Encounter (Signed)
Thank you appointment. Nothing further needed.   Mariella Saa, PharmD, [place], [place] Clinical Specialty Pharmacist [phone]  11/16/2019 12:05 PM

## 2019-11-16 NOTE — Telephone Encounter (Signed)
[place] from  [place] for Breztri from 11/05/19 to 07/11/20.   Phone number: [phone]  Patient received 1st delivery on 11/14/19.  12:02 PM Beatriz Chancellor, CPhT

## 2019-11-16 NOTE — Patient Instructions (Signed)
Medication Instructions:  The current medical regimen is effective;  continue present plan and medications.  *If you need a refill on your cardiac medications before your next appointment, please call your pharmacy*   Follow-Up: At [facility], you and your health needs are our priority.  As part of our continuing mission to provide you with exceptional heart care, we have created designated Provider Care Teams.  These Care Teams include your primary Cardiologist (physician) and Advanced Practice Providers (APPs -  Physician Assistants and Nurse Practitioners) who all work together to provide you with the care you need, when you need it.  We recommend signing up for the patient portal called "MyChart".  Sign up information is provided on this After Visit Summary.  MyChart is used to connect with patients for Virtual Visits (Telemedicine).  Patients are able to view lab/test results, encounter notes, upcoming appointments, etc.  Non-urgent messages can be sent to your provider as well.   To learn more about what you can do with MyChart, go to [email or web address].    Your next appointment:   3 month(s)  The format for your next appointment:   In Person  Provider:   Marion O'Neal, MD     

## 2019-11-27 ENCOUNTER — Telehealth: Payer: Self-pay | Admitting: Pharmacy Technician

## 2019-11-27 NOTE — Telephone Encounter (Signed)
ATC pt, the line rang several times with no answer or VM. Will try back.

## 2019-11-27 NOTE — Telephone Encounter (Signed)
Patient called statin that she thinks Breztri is causing mouth pain and that she thinks she has thrush now. She has stopped medication and has went back to using her Dulera, as she is out of Trelegy.  Patient was [place] to receive Breztri from [place] since she has not spent $600 OOP to qualify for [employer] PAP for Trelegy. Patient states she still has not met $600 OOP yet.   Advised that I would send message to the clinical staff, she is requesting a return call.  Phone# [phone] 

## 2019-12-03 ENCOUNTER — Telehealth: Payer: Self-pay | Admitting: Pharmacist

## 2019-12-03 NOTE — Telephone Encounter (Signed)
Called patient on 12/03/2019 at 11:59 AM   Patient reports she had issues with Judithann Sauger ("tore her mouth up") on 11/27/19. She went back to [facility], which she does not find efficacious.   She is interested in [employer]. Explained to pt that Breztri and Trelegy have same medications in them (ICS/LABA/LAMA). She is adamant about wanting to be on Trelegy Ellipta. She is not able to afford Trelegy Ellipta and is interested in pursuing patient assistance application (PAP) through [place]. She was not able to pursue PAP previously as she did not think she spent $600 OOP at that time.  Patient calls today to state she thinks she has spent $600 OOP.  Discussed with patient that Dr. Loanne Drilling and her team will follow up and send Trelegy PAP application via mail. She also states she is interested in Trelegy prescription to pay while she awaits medication via [place].  Thank you for involving pharmacy to assist in providing this patient's care.   Drexel Iha, PharmD PGY2 Ambulatory Care Pharmacy Resident

## 2019-12-04 ENCOUNTER — Telehealth: Payer: Self-pay | Admitting: Pulmonary Disease

## 2019-12-04 NOTE — Telephone Encounter (Signed)
That is fine , please start TRELEGY patient assistance   Trelegy 1 puff daily.

## 2019-12-04 NOTE — Telephone Encounter (Signed)
Spoke with pt, advised her of message from TP. Pt understood. I guided her to go online and prink the [place] forms. She located them and will fill them out, along with her income statement, and bring them to the office. Will await pt to drop off forms so we can send to [place].

## 2019-12-04 NOTE — Telephone Encounter (Signed)
Pt could not afford trelegy  Was put on breztri  Now wants to try trelegy again with help of pt assistance  Needing rx for trelegy

## 2019-12-04 NOTE — Telephone Encounter (Signed)
Looking at pt's chart, pt was placed on Breztri at last OV due to pt not able to afford Trelegy as she was not able to receive patient assistance yet due to [facility] cost.  Per message 5/24 from [place], pt is now interested in trying to pursue Trelegy pt assistance via [place] as she feels like she has now spent the [facility] cost so wants to try assistance again. Pt states she was having issues with Breztri and wants to go back on Trelegy.  Tammy, please advise if you are fine with [place] sending Rx for Trelegy to pharmacy for pt.

## 2019-12-07 MED ORDER — TRELEGY ELLIPTA 100-62.5-25 MCG/INH IN AEPB: 1.0000 | INHALATION_SPRAY | Freq: Every day | RESPIRATORY_TRACT | 5 refills | Status: DC

## 2019-12-07 NOTE — Telephone Encounter (Signed)
Called and spoke with pt asking her if she was able to begin pt assistance yet for Trelegy and pt stated she had not. Pt was needing [place] to print application out for her and mail it to her. I verified pt's address we had on file to make sure it was correct. Pt also wanted to go ahead and have the Rx for Trelegy sent to pharmacy so that way she could have that OOP expense she could use when filling out the assistance application to see if she could be approved this time. Verified pt's preferred pharmacy and sent Rx to pharmacy for pt. Nothing further needed.

## 2019-12-12 ENCOUNTER — Telehealth: Payer: Self-pay | Admitting: Pulmonary Disease

## 2019-12-12 NOTE — Telephone Encounter (Signed)
Rx for Trelegy was sent to pharmacy by TP on 5/28. Attempted to call pt but unable to reach. Left message for pt to return call.

## 2019-12-13 NOTE — Telephone Encounter (Signed)
LMTCB x2 for pt 

## 2019-12-13 NOTE — Telephone Encounter (Signed)
Pt calling to state that she has picked up her trelegy.

## 2019-12-26 ENCOUNTER — Telehealth: Payer: Self-pay

## 2019-12-26 NOTE — Telephone Encounter (Signed)
Please advise 

## 2019-12-26 NOTE — Telephone Encounter (Signed)
Not appropriate for refill unless she is seen, has a office visit 01/08/2020, arrange for something sooner if needed

## 2019-12-26 NOTE — Telephone Encounter (Signed)
Patient called in to get a prescription refill for  mupirocin ointment (BACTROBAN) 2 % [[id number]]    nystatin (MYCOSTATIN) 100000 UNIT/ML suspension [[id number]   [id number] [place], [place] [address]  [place] [address]  Phone:  [phone] Fax:  [phone]  DEA #:  [id number]

## 2019-12-27 ENCOUNTER — Telehealth: Payer: Self-pay

## 2019-12-27 MED ORDER — NYSTATIN 100000 UNIT/ML MT SUSP: 5.0000 mL | Freq: Four times a day (QID) | OROMUCOSAL | 0 refills | Status: DC

## 2019-12-27 MED ORDER — MUPIROCIN 2 % EX OINT: 1.0000 "application " | TOPICAL_OINTMENT | Freq: Two times a day (BID) | CUTANEOUS | 0 refills | Status: AC | PRN

## 2019-12-27 NOTE — Addendum Note (Signed)
Addended byDamita Dunnings D on: 12/27/2019 11:58 AM   Modules accepted: Orders

## 2019-12-27 NOTE — Telephone Encounter (Addendum)
Per Larose Kells okay to send to 1 tube of each. Pt must be in person next week for visit or she will be dismissed from practice.

## 2019-12-27 NOTE — Telephone Encounter (Signed)
Pt called this morning to check the status of two medication requests.  Per Dr. Larose Kells, the pt must have an appt to be seen before prescribing medications.  I relayed this information to the pt and she was upset about this decision and wanted to speak to the provider about his decision.  I offered the pt an appointment for tomorrow, but pt stated she had to rely on her daughter for transportation and that would not work that is why she has the appt scheduled for 01/08/20.  I went to ask Dr. Larose Kells about doing a virtual visit and he stated the pt would need to be seen in person as it had been quite some time since she had an in person visit and if the pt did not keep the upcoming appt, he would no longer be able to provide care for the pt.  I relayed this to the pt and she felt as if this was a threat.  I let the pt know this was not a threat, but in the pt's best interest so that we can provide the best care possible to her.  Pt was tearful and explained to me she lost her spouse in December 2020.  Dr. Larose Kells agreed to call in enough of the two medications to last the pt until she comes in for her appt on 01/08/20. I let the pt know the medications would be filled until she can be seen on 01/08/20.

## 2019-12-28 ENCOUNTER — Ambulatory Visit: Payer: Medicare Other | Admitting: Internal Medicine

## 2019-12-31 ENCOUNTER — Other Ambulatory Visit: Payer: Self-pay | Admitting: Internal Medicine

## 2020-01-04 ENCOUNTER — Encounter (HOSPITAL_BASED_OUTPATIENT_CLINIC_OR_DEPARTMENT_OTHER): Payer: Medicare Other | Admitting: Cardiovascular Disease

## 2020-01-08 ENCOUNTER — Ambulatory Visit (INDEPENDENT_AMBULATORY_CARE_PROVIDER_SITE_OTHER): Payer: Medicare Other | Admitting: Internal Medicine

## 2020-01-08 ENCOUNTER — Other Ambulatory Visit: Payer: Self-pay

## 2020-01-08 ENCOUNTER — Encounter: Payer: Self-pay | Admitting: Internal Medicine

## 2020-01-08 VITALS — BP 136/92 | HR 118 | Temp 98.3°F | Resp 18 | Ht 64.0 in

## 2020-01-08 DIAGNOSIS — Z79899 Other long term (current) drug therapy: Secondary | ICD-10-CM

## 2020-01-08 DIAGNOSIS — F32A Depression, unspecified: Secondary | ICD-10-CM

## 2020-01-08 DIAGNOSIS — E785 Hyperlipidemia, unspecified: Secondary | ICD-10-CM

## 2020-01-08 DIAGNOSIS — E1169 Type 2 diabetes mellitus with other specified complication: Secondary | ICD-10-CM

## 2020-01-08 DIAGNOSIS — D500 Iron deficiency anemia secondary to blood loss (chronic): Secondary | ICD-10-CM

## 2020-01-08 DIAGNOSIS — E118 Type 2 diabetes mellitus with unspecified complications: Secondary | ICD-10-CM

## 2020-01-08 DIAGNOSIS — F419 Anxiety disorder, unspecified: Secondary | ICD-10-CM

## 2020-01-08 DIAGNOSIS — N390 Urinary tract infection, site not specified: Secondary | ICD-10-CM

## 2020-01-08 DIAGNOSIS — F329 Major depressive disorder, single episode, unspecified: Secondary | ICD-10-CM

## 2020-01-08 LAB — URINALYSIS, ROUTINE W REFLEX MICROSCOPIC
Bilirubin Urine: NEGATIVE
Ketones, ur: NEGATIVE
Nitrite: POSITIVE — AB
Specific Gravity, Urine: <=1.005 — AB (ref 1.000–1.030)
Total Protein, Urine: NEGATIVE
Urine Glucose: NEGATIVE
Urobilinogen, UA: 0.2 (ref 0.0–1.0)
pH: 6 (ref 5.0–8.0)

## 2020-01-08 LAB — MICROALBUMIN / CREATININE URINE RATIO
Creatinine,U: 29.9 mg/dL
Microalb Creat Ratio: 3.3 mg/g (ref 0.0–30.0)
Microalb, Ur: 1 mg/dL (ref 0.0–1.9)

## 2020-01-08 NOTE — Progress Notes (Signed)
Subjective:    Patient ID: Becky Stanley, female    DOB: 17-Jul-1945, 74 y.o.   MRN: [id number]  DOS:  01/08/2020 Type of visit - description: Routine office visit Multiple issues addressed today. Chart is reviewed.    Review of Systems Denies fever chills No chest pain Some shortness of breath at baseline. No nausea, vomiting, diarrhea From time to time has a dark urine, would like urine checked.  Denies dysuria or gross hematuria per se. Emotionally doing okay after the loss of her husband.  Past Medical History:  Diagnosis Date  . Anxiety and depression   . Carpal tunnel syndrome, left   . Chronic UTI, h/o   . COPD (chronic obstructive pulmonary disease) ([place])   . Diabetes mellitus ([place])   . DJD (degenerative joint disease)   . Essential hypertension   . GERD (gastroesophageal reflux disease)   . Hyperlipidemia   . Hypertrophic cardiomyopathy ([place])   . Iron deficiency anemia   . Vitamin D deficiency     Past Surgical History:  Procedure Laterality Date  . CHOLECYSTECTOMY    . ESOPHAGEAL DILATION     multiple procedures per Pt  . TOTAL HIP ARTHROPLASTY Bilateral ~2010 ??    Allergies as of 01/08/2020      Reactions   Aminoglycosides    Ciprofloxacin Nausea And Vomiting   Tolerates levaquin   Erythromycin Nausea And Vomiting   Nitrofurantoin Nausea And Vomiting   Other Nausea And Vomiting   Mussels   Penicillins Hives      Medication List       Accurate as of January 08, 2020 11:59 PM. If you have any questions, ask your nurse or doctor.        STOP taking these medications   Breztri Aerosphere 160-9-4.8 MCG/ACT Aero Generic drug: Budeson-Glycopyrrol-Formoterol Stopped by: Kathlene November, MD     TAKE these medications   albuterol 0.63 MG/3ML nebulizer solution Commonly known as: ACCUNEB Take 3 mLs (0.63 mg total) by nebulization 4 (four) times daily as needed for wheezing or shortness of breath. What changed: Another medication with the same name was  removed. Continue taking this medication, and follow the directions you see here. Changed by: Kathlene November, MD   alum & mag hydroxide-simeth 200-200-20 MG/5ML suspension Commonly known as: MAALOX/MYLANTA Take 15 mLs by mouth every 6 (six) hours as needed for indigestion or heartburn.   apixaban 5 MG Tabs tablet Commonly known as: Eliquis Take 1 tablet (5 mg total) by mouth 2 (two) times daily.   clonazePAM 1 MG tablet Commonly known as: KLONOPIN TAKE 1 TABLET (1 MG TOTAL) BY MOUTH 2 (TWO) TIMES DAILY AS NEEDED FOR ANXIETY.   esomeprazole 20 MG capsule Commonly known as: NEXIUM Take 20 mg by mouth 2 (two) times daily before a meal.   glimepiride 4 MG tablet Commonly known as: AMARYL TAKE 1/2 TABLET EVERY MORNING AND 1 TABLET BY MOUTH AT NIGHT   HYDROcodone-acetaminophen 5-325 MG tablet Commonly known as: NORCO/VICODIN Take 1 tablet by mouth every 6 (six) hours as needed for moderate pain.   levofloxacin 250 MG tablet Commonly known as: LEVAQUIN Take 250 mg by mouth daily as needed.   lovastatin 40 MG tablet Commonly known as: MEVACOR TAKE 1 TABLET BY MOUTH EVERYDAY AT BEDTIME   metFORMIN 500 MG 24 hr tablet Commonly known as: GLUCOPHAGE-XR TAKE 2 TABLETS BY MOUTH TWICE A DAY   metoprolol tartrate 100 MG tablet Commonly known as: LOPRESSOR Take 2 tablets (200 mg  total) by mouth 2 (two) times daily.   mupirocin ointment 2 % Commonly known as: BACTROBAN Apply 1 application topically 2 (two) times daily as needed.   nystatin 100000 UNIT/ML suspension Commonly known as: MYCOSTATIN Take 5 mLs (500,000 Units total) by mouth 4 (four) times daily.   OneTouch Delica Lancets 70W Misc Check blood sugars once daily   OneTouch Verio test strip Generic drug: glucose blood Check blood sugar once daily   PROBIOTIC-10 PO Take 1 capsule by mouth daily.   sertraline 100 MG tablet Commonly known as: ZOLOFT TAKE 1 AND 1/2 TABLETS BY MOUTH EVERY DAY   torsemide 20 MG  tablet Commonly known as: DEMADEX Take 1 tablet (20 mg total) by mouth daily.   Trelegy Ellipta 100-62.5-25 MCG/INH Aepb Generic drug: Fluticasone-Umeclidin-Vilant Inhale 1 puff into the lungs daily.          Objective:   Physical Exam BP (!) 136/92 (BP Location: Right Arm, Patient Position: Sitting, Cuff Size: Normal)   Pulse (!) 118   Temp 98.3 F (36.8 C) (Temporal)   Resp 18   Ht 5\' 4"  (1.626 m)   SpO2 95%  General:   Well developed, NAD, BMI noted. HEENT:  Normocephalic . Face symmetric, atraumatic Lungs:  CTA B Normal respiratory effort, no intercostal retractions, no accessory muscle use. Heart: Irregularly irregular,  no murmur.  Lower extremities: + Lower extremity edema worse on the left Skin: Not pale. Not jaundice Neurologic:  alert & oriented X3.  Speech normal, gait not tested, sits on a scooter Psych--  Cognition and judgment appear intact.  Cooperative with normal attention span and concentration.  Behavior appropriate. No anxious or depressed appearing.      Assessment    ASSESSMENT (new patient 10-2018 referred by her daughter [name]) DM HTN High cholesterol Alcoholic sober since 2376, [place] meetings  Anxiety: On sertraline, clonazepam Pulmonary: Asthma, COPD, smoker/vaping ----admitted 06/2017: Acute hypoxic respiratory failure ----CT of the chest in August 2017 which revealed mild groundglass changes primarily in the upper lobes with no noted masses, nodules, or lesions Iron deficiency anemia CV: -CHF DX 06/2017 -HCOM ( avoid excessive diuresis) -Atrial fibrillation, new DX 07/27/2019 -- LE edema L>R DJD Migraines: On hydrocodone as needed Recurrent UTIs: At some point saw infectious diseases, was Rx Levaquin for 3 days as needed.  She is allergic to Cipro but tolerates Levaquin.  As of 10-2018, UTIs are less frequent. Uses a scooter since hip surgery ~ 2010  PLAN: DM: Currently on Glimepiride, Metformin, ambulatory CBGs typically in the  130.  Check a A1c.  Unable to exercise. HTN: Currently on metoprolol, Demadex, BP today is okay, diastolic BP slightly high.  Check a CMP.  Encouraged to check ambulatory BPs.  Check CBC High cholesterol: On lovastatin, check a lipid panel CV: Last visit with cardiology 11/16/2019, felt to be stable (was a virtual visit).  Declined to be weighed at today.  Heart rate moderately elevated today, assessment RTC. History of EtOH: Sober Anxiety: Loss of her husband, on sertraline and clonazepam, her PHQ-9 is elevated, fortunately she stated she is "okay".  No suicidal ideas.  Declined increase sertraline.  Check a UDS  Recurrent UTI: Request a UA urine culture, recently her urine has been darker than usual, will do.  Her standard treatment is self to start antibiotics with symptoms however she has not taken antibiotics for a while. Preventive care reviewed RTC 3 months This visit occurred during the SARS-CoV-2 public health emergency.  Safety protocols were  in place, including screening questions prior to the visit, additional usage of staff PPE, and extensive cleaning of exam room while observing appropriate contact time as indicated for disinfecting solutions.

## 2020-01-08 NOTE — Progress Notes (Signed)
Pre visit review using our clinic review tool, if applicable. No additional management support is needed unless otherwise documented below in the visit note. 

## 2020-01-08 NOTE — Patient Instructions (Addendum)
Please schedule Medicare Wellness with Glenard Haring.   Per our records you are due for an eye exam. Please contact your eye doctor to schedule an appointment. Please have them send copies of your office visit notes to [place]. Our fax number is [phone].   Check the  blood pressure weekly BP GOAL is between 110/65 and  135/85. If it is consistently higher or lower, let me know   GO TO THE LAB : Get the blood work     [place], [place] back for a checkup in 3 months

## 2020-01-09 ENCOUNTER — Telehealth: Payer: Self-pay | Admitting: Internal Medicine

## 2020-01-09 DIAGNOSIS — Z7189 Other specified counseling: Secondary | ICD-10-CM | POA: Insufficient documentation

## 2020-01-09 DIAGNOSIS — M159 Polyosteoarthritis, unspecified: Secondary | ICD-10-CM

## 2020-01-09 LAB — LIPID PANEL
Cholesterol: 138 mg/dL (ref 0–200)
HDL: 35.3 mg/dL — ABNORMAL LOW (ref >39.00)
LDL Cholesterol: 71 mg/dL (ref 0–99)
NonHDL: 102.48
Total CHOL/HDL Ratio: 4
Triglycerides: 158 mg/dL — ABNORMAL HIGH (ref 0.0–149.0)
VLDL: 31.6 mg/dL (ref 0.0–40.0)

## 2020-01-09 LAB — CBC WITH DIFFERENTIAL/PLATELET
Basophils Absolute: 0.2 10*3/uL — ABNORMAL HIGH (ref 0.0–0.1)
Basophils Relative: 1.7 % (ref 0.0–3.0)
Eosinophils Absolute: 0.2 10*3/uL (ref 0.0–0.7)
Eosinophils Relative: 2.4 % (ref 0.0–5.0)
HCT: 39 % (ref 36.0–46.0)
Hemoglobin: 12.8 g/dL (ref 12.0–15.0)
Lymphocytes Relative: 18.8 % (ref 12.0–46.0)
Lymphs Abs: 1.8 10*3/uL (ref 0.7–4.0)
MCHC: 32.7 g/dL (ref 30.0–36.0)
MCV: 85 fl (ref 78.0–100.0)
Monocytes Absolute: 0.7 10*3/uL (ref 0.1–1.0)
Monocytes Relative: 7.4 % (ref 3.0–12.0)
Neutro Abs: 6.5 10*3/uL (ref 1.4–7.7)
Neutrophils Relative %: 69.7 % (ref 43.0–77.0)
Platelets: 261 10*3/uL (ref 150.0–400.0)
RBC: 4.59 Mil/uL (ref 3.87–5.11)
RDW: 17.2 % — ABNORMAL HIGH (ref 11.5–15.5)
WBC: 9.4 10*3/uL (ref 4.0–10.5)

## 2020-01-09 LAB — COMPREHENSIVE METABOLIC PANEL
ALT: 18 U/L (ref 0–35)
AST: 24 U/L (ref 0–37)
Albumin: 4.1 g/dL (ref 3.5–5.2)
Alkaline Phosphatase: 51 U/L (ref 39–117)
BUN: 18 mg/dL (ref 6–23)
CO2: 27 mEq/L (ref 19–32)
Calcium: 9.4 mg/dL (ref 8.4–10.5)
Chloride: 96 mEq/L (ref 96–112)
Creatinine, Ser: 0.8 mg/dL (ref 0.40–1.20)
GFR: 70.34 mL/min (ref >60.00)
Glucose, Bld: 145 mg/dL — ABNORMAL HIGH (ref 70–99)
Potassium: 4.2 mEq/L (ref 3.5–5.1)
Sodium: 133 mEq/L — ABNORMAL LOW (ref 135–145)
Total Bilirubin: 0.5 mg/dL (ref 0.2–1.2)
Total Protein: 6.8 g/dL (ref 6.0–8.3)

## 2020-01-09 LAB — HEMOGLOBIN A1C: Hgb A1c MFr Bld: 6.9 % — ABNORMAL HIGH (ref 4.6–6.5)

## 2020-01-09 NOTE — Telephone Encounter (Signed)
Caller : Nicholle  Call Back # [phone]  Patient states that she would like a new handi cap sticker. Patient states her handi cap sticker has ran out, Pt has recently moved from [facility].   Patient states that she needs a prescription from the he doctor for a new scooter. Per patient she will not have to pay sales tax if has a prescription.    Please Advise

## 2020-01-09 NOTE — Telephone Encounter (Signed)
-  She has been using a scooter for years, okay to send a prescription.  DX: DJD, history of bilateral hip replacement, gait disorder, CHF, atrial fibrillation, COPD -Parking sticker signed

## 2020-01-09 NOTE — Telephone Encounter (Signed)
Spoke w/ Pt- she requests that the handicap placard be mailed to her, copy sent for scanning. The Rx for motorized wheelchair faxed to ATTN: Elta Guadeloupe at [facility] at [phone]. Fax confirmation received.

## 2020-01-09 NOTE — Assessment & Plan Note (Signed)
DM: Currently on Glimepiride, Metformin, ambulatory CBGs typically in the 130.  Check a A1c.  Unable to exercise. HTN: Currently on metoprolol, Demadex, BP today is okay, diastolic BP slightly high.  Check a CMP.  Encouraged to check ambulatory BPs.  Check CBC High cholesterol: On lovastatin, check a lipid panel CV: Last visit with cardiology 11/16/2019, felt to be stable (was a virtual visit).  Declined to be weighed at today.  Heart rate moderately elevated today, assessment RTC. History of EtOH: Sober Anxiety: Loss of her husband, on sertraline and clonazepam, her PHQ-9 is elevated, fortunately she stated she is "okay".  No suicidal ideas.  Declined increase sertraline.  Check a UDS  Recurrent UTI: Request a UA urine culture, recently her urine has been darker than usual, will do.  Her standard treatment is self to start antibiotics with symptoms however she has not taken antibiotics for a while. Preventive care reviewed RTC 3 months

## 2020-01-09 NOTE — Assessment & Plan Note (Signed)
She needs multiple immunizations, mammograms, GI referral etc., not inclined to proceed with any of that.  Care everywhere reviewed:  Td: No records PNM: 2008 (Exira 13?  23?) s/p Pfizer Covid shot x2 Recommend flu shot yearly No previous colonoscopy No previous bone density MMG: 2016

## 2020-01-09 NOTE — Telephone Encounter (Signed)
Forms placed in PCP red folder to be signed.

## 2020-01-10 LAB — DRUG MONITORING, PANEL 8 WITH CONFIRMATION, URINE
6 Acetylmorphine: NEGATIVE ng/mL (ref <10)
Alcohol Metabolites: NEGATIVE ng/mL
Alphahydroxyalprazolam: NEGATIVE ng/mL (ref <25)
Alphahydroxymidazolam: NEGATIVE ng/mL (ref <50)
Alphahydroxytriazolam: NEGATIVE ng/mL (ref <50)
Aminoclonazepam: 145 ng/mL — ABNORMAL HIGH (ref <25)
Amphetamines: NEGATIVE ng/mL (ref <500)
Benzodiazepines: POSITIVE ng/mL — AB (ref <100)
Buprenorphine, Urine: NEGATIVE ng/mL (ref <5)
Cocaine Metabolite: NEGATIVE ng/mL (ref <150)
Creatinine: 31.6 mg/dL
Hydroxyethylflurazepam: NEGATIVE ng/mL (ref <50)
Lorazepam: NEGATIVE ng/mL (ref <50)
MDMA: NEGATIVE ng/mL (ref <500)
Marijuana Metabolite: NEGATIVE ng/mL (ref <20)
Nordiazepam: NEGATIVE ng/mL (ref <50)
Opiates: NEGATIVE ng/mL (ref <100)
Oxazepam: NEGATIVE ng/mL (ref <50)
Oxidant: NEGATIVE ug/mL
Oxycodone: NEGATIVE ng/mL (ref <100)
Temazepam: NEGATIVE ng/mL (ref <50)
pH: 5.9 (ref 4.5–9.0)

## 2020-01-10 LAB — URINE CULTURE
MICRO NUMBER:: 10647284
SPECIMEN QUALITY:: ADEQUATE

## 2020-01-10 LAB — DM TEMPLATE

## 2020-01-11 ENCOUNTER — Other Ambulatory Visit: Payer: Self-pay

## 2020-01-11 ENCOUNTER — Telehealth: Payer: Self-pay | Admitting: Internal Medicine

## 2020-01-11 MED ORDER — SULFAMETHOXAZOLE-TRIMETHOPRIM 800-160 MG PO TABS
1.0000 | ORAL_TABLET | Freq: Two times a day (BID) | ORAL | 0 refills | Status: DC
Start: 2020-01-11 — End: 2020-04-15

## 2020-01-11 NOTE — Telephone Encounter (Signed)
Caller: Shifra Call back phone number:[phone]  Patient would like to talk to someone in regards to her lab results.

## 2020-01-11 NOTE — Telephone Encounter (Signed)
Spoke w/ Pt- informed of results and recommendations. Pt verbalized understanding.  

## 2020-01-13 ENCOUNTER — Telehealth: Payer: Self-pay | Admitting: Pulmonary Disease

## 2020-01-13 MED ORDER — TRELEGY ELLIPTA 100-62.5-25 MCG/INH IN AEPB: 1.0000 | INHALATION_SPRAY | Freq: Every day | RESPIRATORY_TRACT | 5 refills | Status: DC

## 2020-01-13 NOTE — Telephone Encounter (Signed)
Received page from [place] answering service from Becky Stanley.  She says that she has been prescribed Trelegy from our office, but based on recent prescription there are no refills authorized.  Per my record review our office sent the Rx with 5 refills, but she says that her prescription doesn't reflect the refills.  Will go ahead and send another Rx per patient request, but I have also advised she call her pharmacy to discuss further.

## 2020-01-15 NOTE — Telephone Encounter (Signed)
Left VM x 1 and requested a return call back to verify that she was able to get her medication filled at her pharmacy.

## 2020-01-16 ENCOUNTER — Other Ambulatory Visit: Payer: Self-pay | Admitting: Internal Medicine

## 2020-01-22 ENCOUNTER — Encounter: Payer: Self-pay | Admitting: Internal Medicine

## 2020-01-28 ENCOUNTER — Other Ambulatory Visit: Payer: Self-pay

## 2020-01-28 DIAGNOSIS — Z1231 Encounter for screening mammogram for malignant neoplasm of breast: Secondary | ICD-10-CM

## 2020-01-28 DIAGNOSIS — Z0100 Encounter for examination of eyes and vision without abnormal findings: Secondary | ICD-10-CM

## 2020-01-28 DIAGNOSIS — E119 Type 2 diabetes mellitus without complications: Secondary | ICD-10-CM

## 2020-02-07 ENCOUNTER — Other Ambulatory Visit: Payer: Self-pay

## 2020-02-07 ENCOUNTER — Ambulatory Visit (HOSPITAL_BASED_OUTPATIENT_CLINIC_OR_DEPARTMENT_OTHER)
Admission: RE | Admit: 2020-02-07 | Discharge: 2020-02-07 | Disposition: A | Discharge status: Home / Self Care | Payer: Medicare Other | Source: Ambulatory Visit | Attending: Internal Medicine | Admitting: Internal Medicine

## 2020-02-07 DIAGNOSIS — Z1231 Encounter for screening mammogram for malignant neoplasm of breast: Secondary | ICD-10-CM | POA: Insufficient documentation

## 2020-02-14 ENCOUNTER — Telehealth: Payer: Self-pay | Admitting: Internal Medicine

## 2020-02-14 MED ORDER — NYSTATIN 100000 UNIT/ML MT SUSP: 5.0000 mL | Freq: Four times a day (QID) | OROMUCOSAL | 0 refills | Status: DC

## 2020-02-14 NOTE — Telephone Encounter (Signed)
Caller:Sandrine Call back # [phone]  Patient would like to know if you could send a larger bottle medication is not enough if she takes it 4 times a day.  nystatin (MYCOSTATIN) 100000 UNIT/ML suspension [[id number]]   [id number] [place] [place] [address]  [place], [place] [address]  Phone:  [phone] Fax:  [phone]

## 2020-02-14 NOTE — Telephone Encounter (Signed)
Rx sent for larger quantity.

## 2020-02-18 ENCOUNTER — Telehealth: Payer: Self-pay | Admitting: *Deleted

## 2020-02-18 NOTE — Telephone Encounter (Signed)
-----   Message from [place], MD sent at 02/12/2020  9:39 PM EDT ----- Please schedule patient for f/u with me when clinic is open in 1 month.

## 2020-02-18 NOTE — Telephone Encounter (Signed)
Called spoke with patient.  Appt made 03/19/20 at 1:30 with Becky Stanley  Nothing further needed at this time

## 2020-02-20 NOTE — Progress Notes (Deleted)
Cardiology Office Note:   Date:  02/20/2020  NAME:  Becky Stanley    MRN: [id number] DOB:  04/24/1946   PCP:  Colon Branch, MD  Cardiologist:  Evalina Field, MD  Electrophysiologist:  None   Referring MD: Colon Branch, MD   No chief complaint on file. ***  History of Present Illness:   Becky Stanley is a 74 y.o. female with a hx of HFpEF, apical variant HCM with apical aneurysm, persistent Afib, HTN, DM obesity, COPD who presents for follow-up.    Problem List 1. 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 HTN 3.Diastolic heart failure -EF 50-55% -G2DD 4. Obesity 5.Diabetes (A1c 7.8) 6. Atrial fibrillation, persistent -diagnosed 07/30/2019 -apical variant HCM with apical aneurysm needs AC 7. Tobacco abuse -e cigarettes currently  -35+ years smoking 8. COPD  Past Medical History: Past Medical History:  Diagnosis Date  . Anxiety and depression   . Carpal tunnel syndrome, left   . Chronic UTI, h/o   . COPD (chronic obstructive pulmonary disease) ([place])   . Diabetes mellitus ([place])   . DJD (degenerative joint disease)   . Essential hypertension   . GERD (gastroesophageal reflux disease)   . Hyperlipidemia   . Hypertrophic cardiomyopathy ([place])   . Iron deficiency anemia   . Vitamin D deficiency     Past Surgical History: Past Surgical History:  Procedure Laterality Date  . CHOLECYSTECTOMY    . ESOPHAGEAL DILATION     multiple procedures per Pt  . TOTAL HIP ARTHROPLASTY Bilateral ~2010 ??    Current Medications: No outpatient medications have been marked as taking for the 02/22/20 encounter (Appointment) with O'Neal, Cassie Freer, MD.     Allergies:    Aminoglycosides, Ciprofloxacin, Erythromycin, Nitrofurantoin, Other, and Penicillins   Social History: Social History   Socioeconomic History  . Marital status: Widowed    Spouse name: Becky Stanley  . Number of children: 2  . Years of education: Not on file  .  Highest education level: Not on file  Occupational History  . Occupation: retired  Tobacco Use  . Smoking status: Former Smoker    Packs/day: 1.00    Years: 35.00    Pack years: 35.00    Types: E-cigarettes    Start date: 07/12/1958    Quit date: 09/10/1998    Years since quitting: 21.4  . Smokeless tobacco: Never Used  . Tobacco comment: quit 20 years, started back about 5 years ago, vaping  Vaping Use  . Vaping Use: Every day  . Start date: 09/09/2017  . Substances: Nicotine  Substance and Sexual Activity  . Alcohol use: Never    Comment: h/o ETOH abuse   . Drug use: Never  . Sexual activity: Not on file  Other Topics Concern  . Not on file  Social History Narrative   Moved from [place] to the [place] area home 10/12/2018; lives at her daughter.   Lost husband Becky Stanley 06/2019   Former smoker, as of 10-2018 vaping with plans to quit   Social Determinants of Health   Financial Resource Strain:   . Difficulty of Paying Living Expenses:   Food Insecurity:   . Worried About Charity fundraiser in the Last Year:   . Arboriculturist in the Last Year:   Transportation Needs:   . Film/video editor (Medical):   Marland Kitchen Lack of Transportation (Non-Medical):   Physical Activity:   . Days of  Exercise per Week:   . Minutes of Exercise per Session:   Stress:   . Feeling of Stress :   Social Connections:   . Frequency of Communication with Friends and Family:   . Frequency of Social Gatherings with Friends and Family:   . Attends Religious Services:   . Active Member of Clubs or Organizations:   . Attends Archivist Meetings:   Marland Kitchen Marital Status:      Family History: The patient's ***family history includes CAD in her paternal grandfather; COPD in her father; Colon cancer (age of onset: 4) in her father; Multiple sclerosis in her mother. There is no history of Breast cancer.  ROS:   All other ROS reviewed and negative. Pertinent positives noted in the HPI.      EKGs/Labs/Other Studies Reviewed:   The following studies were personally reviewed by me today:  EKG:  EKG is *** ordered today.  The ekg ordered today demonstrates ***, and was personally reviewed by me.   TTE 06/29/2019 1. Left ventricular ejection fraction, by visual estimation, is 50 to  55%. The left ventricle has normal function. There is mildly increased  left ventricular hypertrophy.  2. Elevated left atrial pressure.  3. Left ventricular diastolic parameters are consistent with Grade II  diastolic dysfunction (pseudonormalization).  4. The left ventricle demonstrates regional wall motion abnormalities.  5. Global right ventricle has normal systolic function.The right  ventricular size is normal.  6. Left atrial size was mildly dilated.  7. Right atrial size was normal.  8. Mild mitral annular calcification.  9. The mitral valve is normal in structure. No evidence of mitral valve  regurgitation. No evidence of mitral stenosis.  10. The tricuspid valve is normal in structure. Tricuspid valve  regurgitation is trivial.  11. The aortic valve has an indeterminant number of cusps. Aortic valve  regurgitation is not visualized. No evidence of aortic valve sclerosis or  stenosis.  12. The pulmonic valve was not well visualized. Pulmonic valve  regurgitation is not visualized.  13. The inferior vena cava is normal in size with greater than 50%  respiratory variability, suggesting right atrial pressure of 3 mmHg.  14. Extremely limited due to poor sound wave transmission; apical akinesis  with turbulence in mid LVOT and apex; overall low normal LV systolic  function; grade 2 diastolic dysfunction; mild LAE; suggest cardiac MRI to  better assess LV function and LV  apex.   Recent Labs: 06/15/2019: TSH 1.81 09/28/2019: BNP 348.7 01/08/2020: ALT 18; BUN 18; Creatinine, Ser 0.80; Hemoglobin 12.8; Platelets 261.0; Potassium 4.2; Sodium 133   Recent Lipid Panel     Component Value Date/Time   CHOL 138 01/08/2020 1349   TRIG 158.0 (H) 01/08/2020 1349   HDL 35.30 (L) 01/08/2020 1349   CHOLHDL 4 01/08/2020 1349   VLDL 31.6 01/08/2020 1349   LDLCALC 71 01/08/2020 1349    Physical Exam:   VS:  There were no vitals taken for this visit.   Wt Readings from Last 3 Encounters:  No data found for Wt    General: Well nourished, well developed, in no acute distress Heart: Atraumatic, normal size  Eyes: PEERLA, EOMI  Neck: Supple, no JVD Endocrine: No thryomegaly Cardiac: Normal S1, S2; RRR; no murmurs, rubs, or gallops Lungs: Clear to auscultation bilaterally, no wheezing, rhonchi or rales  Abd: Soft, nontender, no hepatomegaly  Ext: No edema, pulses 2+ Musculoskeletal: No deformities, BUE and BLE strength normal and equal Skin: Warm and dry,  no rashes   Neuro: Alert and oriented to person, place, time, and situation, CNII-XII grossly intact, no focal deficits  Psych: Normal mood and affect   ASSESSMENT:   Adely A Culp is a 74 y.o. female who presents for the following: No diagnosis found.  PLAN:   There are no diagnoses linked to this encounter.  Disposition: No follow-ups on file.  Medication Adjustments/Labs and Tests Ordered: Current medicines are reviewed at length with the patient today.  Concerns regarding medicines are outlined above.  No orders of the defined types were placed in this encounter.  No orders of the defined types were placed in this encounter.   There are no Patient Instructions on file for this visit.   Time Spent with Patient: I have spent a total of *** minutes with patient reviewing hospital notes, telemetry, EKGs, labs and examining the patient as well as establishing an assessment and plan that was discussed with the patient.  > 50% of time was spent in direct patient care.  Signed, Addison Naegeli. Audie Box, [place]  [address] [place], [place] [address] [phone]   02/20/2020 2:48 PM

## 2020-02-22 ENCOUNTER — Ambulatory Visit: Payer: Medicare Other | Admitting: Cardiovascular Disease

## 2020-03-07 ENCOUNTER — Other Ambulatory Visit: Payer: Self-pay | Admitting: Internal Medicine

## 2020-03-15 ENCOUNTER — Telehealth: Payer: Self-pay | Admitting: Internal Medicine

## 2020-03-15 DIAGNOSIS — F419 Anxiety disorder, unspecified: Secondary | ICD-10-CM

## 2020-03-18 NOTE — Telephone Encounter (Signed)
PDMP okay, Rx sent 

## 2020-03-18 NOTE — Telephone Encounter (Signed)
Clonazepam refill.   Last OV: 01/08/2020, appt 9/29 Last Fill; 11/06/2019 #60 and 2RF Pt sig: 1 tab bid prn UDS: 01/08/2020 Low risk

## 2020-03-19 ENCOUNTER — Ambulatory Visit: Payer: Medicare Other | Admitting: Pulmonary Disease

## 2020-03-24 NOTE — Progress Notes (Signed)
Cardiology Office Note:   Date:  03/26/2020  NAME:  [name]    MRN: [id number] DOB:  1945-10-15   PCP:  Colon Branch, MD  Cardiologist:  Evalina Field, MD   Referring MD: Colon Branch, MD   Chief Complaint  Patient presents with   Follow-up   History of Present Illness:   Becky Stanley is a 74 y.o. female with a hx of apical variant HCM, HFpEF, atrial fibrillation, DM, HTN, COPD who presents for follow-up.  She reports she is doing well.  She appears euvolemic on exam.  She is taking torsemide 20 mg daily.  She takes an extra tablet as needed.  She has been able to get patient assistance on Eliquis.  She is now on this 5 mg twice daily.  She denies any rapid heartbeat sensation.  She appears to be doing well.  She started Trelegy for her pulmonary disease.  Things are stable on this.  She is still not exercising but has no major limitations with her current level of activity.  She does use a scooter.  Overall appears to be doing quite well.  She is still smoking.  She uses vaping products.  I have advised her to quit.  Overall denies symptoms of chest pain, shortness of breath or palpitations.  Problem List 1. 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 HTN 3.Diastolic heart failure -EF 50-55% -G2DD 4. Obesity 5.Diabetes (A1c 7.8) 6. Atrial fibrillation, persistent -diagnosed 07/30/2019 7. Tobacco abuse -e cigarettes currently  -35+ years smoking  8. COPD  Past Medical History: Past Medical History:  Diagnosis Date   Anxiety and depression    Carpal tunnel syndrome, left    Chronic UTI, h/o    COPD (chronic obstructive pulmonary disease) (HCC)    Diabetes mellitus (HCC)    DJD (degenerative joint disease)    Essential hypertension    GERD (gastroesophageal reflux disease)    Hyperlipidemia    Hypertrophic cardiomyopathy (HCC)    Iron deficiency anemia    Vitamin D deficiency     Past Surgical  History: Past Surgical History:  Procedure Laterality Date   CHOLECYSTECTOMY     ESOPHAGEAL DILATION     multiple procedures per Pt   TOTAL HIP ARTHROPLASTY Bilateral ~2010 ??    Current Medications: Current Meds  Medication Sig   albuterol (ACCUNEB) 0.63 MG/3ML nebulizer solution Take 3 mLs (0.63 mg total) by nebulization 4 (four) times daily as needed for wheezing or shortness of breath.   alum & mag hydroxide-simeth (MAALOX/MYLANTA) 200-200-20 MG/5ML suspension Take 15 mLs by mouth every 6 (six) hours as needed for indigestion or heartburn.   apixaban (ELIQUIS) 5 MG TABS tablet Take 1 tablet (5 mg total) by mouth 2 (two) times daily.   clonazePAM (KLONOPIN) 1 MG tablet TAKE 1 TABLET (1 MG TOTAL) BY MOUTH 2 (TWO) TIMES DAILY AS NEEDED FOR ANXIETY.   esomeprazole (NEXIUM) 20 MG capsule Take 20 mg by mouth 2 (two) times daily before a meal.   Fluticasone-Umeclidin-Vilant (TRELEGY ELLIPTA) 100-62.5-25 MCG/INH AEPB Inhale 1 puff into the lungs daily.   glimepiride (AMARYL) 4 MG tablet Take 1/2 tablet every morning and 1 tablet by mouth at night   glucose blood (ONETOUCH VERIO) test strip Check blood sugar once daily   HYDROcodone-acetaminophen (NORCO/VICODIN) 5-325 MG tablet Take 1 tablet by mouth every 6 (six) hours as needed for moderate pain.   levofloxacin (LEVAQUIN) 250 MG tablet  Take 250 mg by mouth daily as needed.    lovastatin (MEVACOR) 40 MG tablet Take 1 tablet (40 mg total) by mouth at bedtime.   metFORMIN (GLUCOPHAGE-XR) 500 MG 24 hr tablet Take 2 tablets (1,000 mg total) by mouth 2 (two) times daily.   metoprolol tartrate (LOPRESSOR) 100 MG tablet Take 2 tablets (200 mg total) by mouth 2 (two) times daily.   mupirocin ointment (BACTROBAN) 2 % Apply 1 application topically 2 (two) times daily as needed.   nystatin (MYCOSTATIN) 100000 UNIT/ML suspension Take 5 mLs (500,000 Units total) by mouth 4 (four) times daily.   OneTouch Delica Lancets 17E MISC Check  blood sugars once daily   Probiotic Product (PROBIOTIC-10 PO) Take 1 capsule by mouth daily.   sertraline (ZOLOFT) 100 MG tablet TAKE 1 AND 1/2 TABLETS BY MOUTH EVERY DAY   sulfamethoxazole-trimethoprim (BACTRIM DS) 800-160 MG tablet Take 1 tablet by mouth 2 (two) times daily.   [DISCONTINUED] apixaban (ELIQUIS) 5 MG TABS tablet Take 1 tablet (5 mg total) by mouth 2 (two) times daily.   [DISCONTINUED] metoprolol tartrate (LOPRESSOR) 100 MG tablet Take 2 tablets (200 mg total) by mouth 2 (two) times daily.     Allergies:    Aminoglycosides, Ciprofloxacin, Erythromycin, Nitrofurantoin, Other, and Penicillins   Social History: Social History   Socioeconomic History   Marital status: Widowed    Spouse name: Becky Stanley   Number of children: 2   Years of education: Not on file   Highest education level: Not on file  Occupational History   Occupation: retired  Tobacco Use   Smoking status: Former Smoker    Packs/day: 1.00    Years: 35.00    Pack years: 35.00    Types: E-cigarettes    Start date: 07/12/1958    Quit date: 09/10/1998    Years since quitting: 21.5   Smokeless tobacco: Never Used   Tobacco comment: quit 20 years, started back about 5 years ago, vaping  Vaping Use   Vaping Use: Every day   Start date: 09/09/2017   Substances: Nicotine  Substance and Sexual Activity   Alcohol use: Never    Comment: h/o ETOH abuse    Drug use: Never   Sexual activity: Not on file  Other Topics Concern   Not on file  Social History Narrative   Moved from [place] to the [place] area home 10/12/2018; lives at her daughter.   Lost husband Becky Stanley 06/2019   Former smoker, as of 10-2018 vaping with plans to quit   Social Determinants of Health   Financial Resource Strain:    Difficulty of Paying Living Expenses: Not on file  Food Insecurity:    Worried About Charity fundraiser in the Last Year: Not on file   [employer] of Food in the Last Year: Not on file  Transportation Needs:     Lack of Transportation (Medical): Not on file   Lack of Transportation (Non-Medical): Not on file  Physical Activity:    Days of Exercise per Week: Not on file   Minutes of Exercise per Session: Not on file  Stress:    Feeling of Stress : Not on file  Social Connections:    Frequency of Communication with Friends and Family: Not on file   Frequency of Social Gatherings with Friends and Family: Not on file   Attends Religious Services: Not on file   Active Member of Clubs or Organizations: Not on file   Attends Archivist Meetings: Not  on file   Marital Status: Not on file     Family History: The patient's family history includes CAD in her paternal grandfather; COPD in her father; Colon cancer (age of onset: 24) in her father; Multiple sclerosis in her mother. There is no history of Breast cancer.  ROS:   All other ROS reviewed and negative. Pertinent positives noted in the HPI.     EKGs/Labs/Other Studies Reviewed:   The following studies were personally reviewed by me today:  TTE 06/29/2019 1. Left ventricular ejection fraction, by visual estimation, is 50 to  55%. The left ventricle has normal function. There is mildly increased  left ventricular hypertrophy.  2. Elevated left atrial pressure.  3. Left ventricular diastolic parameters are consistent with Grade II  diastolic dysfunction (pseudonormalization).  4. The left ventricle demonstrates regional wall motion abnormalities.  5. Global right ventricle has normal systolic function.The right  ventricular size is normal.  6. Left atrial size was mildly dilated.  7. Right atrial size was normal.  8. Mild mitral annular calcification.  9. The mitral valve is normal in structure. No evidence of mitral valve  regurgitation. No evidence of mitral stenosis.  10. The tricuspid valve is normal in structure. Tricuspid valve  regurgitation is trivial.  11. The aortic valve has an indeterminant  number of cusps. Aortic valve  regurgitation is not visualized. No evidence of aortic valve sclerosis or  stenosis.  12. The pulmonic valve was not well visualized. Pulmonic valve  regurgitation is not visualized.  13. The inferior vena cava is normal in size with greater than 50%  respiratory variability, suggesting right atrial pressure of 3 mmHg.  14. Extremely limited due to poor sound wave transmission; apical akinesis  with turbulence in mid LVOT and apex; overall low normal LV systolic  function; grade 2 diastolic dysfunction; mild LAE; suggest cardiac MRI to  better assess LV function and LV  apex.   Recent Labs: 06/15/2019: TSH 1.81 09/28/2019: BNP 348.7 01/08/2020: ALT 18; BUN 18; Creatinine, Ser 0.80; Hemoglobin 12.8; Platelets 261.0; Potassium 4.2; Sodium 133   Recent Lipid Panel    Component Value Date/Time   CHOL 138 01/08/2020 1349   TRIG 158.0 (H) 01/08/2020 1349   HDL 35.30 (L) 01/08/2020 1349   CHOLHDL 4 01/08/2020 1349   VLDL 31.6 01/08/2020 1349   LDLCALC 71 01/08/2020 1349    Physical Exam:   VS:  BP 124/66    Pulse (!) 102    Ht 5\' 4"  (1.626 m)    Wt Readings from Last 3 Encounters:  No data found for Wt    General: Well nourished, well developed, in no acute distress Heart: Atraumatic, normal size  Eyes: PEERLA, EOMI  Neck: Supple, no JVD Endocrine: No thryomegaly Cardiac: Normal S1, S2; irregular rhythm, no murmurs rubs or gallops Lungs: Clear to auscultation bilaterally, no wheezing, rhonchi or rales  Abd: Soft, nontender, no hepatomegaly  Ext: Trace edema Musculoskeletal: No deformities, BUE and BLE strength normal and equal Skin: Warm and dry, no rashes   Neuro: Alert and oriented to person, place, time, and situation, CNII-XII grossly intact, no focal deficits  Psych: Normal mood and affect   ASSESSMENT:   Becky Stanley is a 74 y.o. female who presents for the following: 1. Persistent atrial fibrillation ([place])   2. Chronic diastolic heart  failure ([place])   3. Hypertrophic cardiomyopathy ([place])   4. Essential hypertension   5. Tobacco abuse     PLAN:  1. Persistent atrial fibrillation (HCC) -Rate controlled on metoprolol.  We will continue 200 mg twice daily.  She was able to get patient assistance and is on Eliquis 5 mg twice daily.  She will continue this.  2. Chronic diastolic heart failure ([place]) -Euvolemic on exam today.  Continue torsemide 20 mg daily as needed.  3. Hypertrophic cardiomyopathy ([place]) -She has apical variant hypertrophic cardiomyopathy.  She does have an apical aneurysm.  On Eliquis.  Given age we will forego ICD evaluation.  No alarming symptoms such as syncope or sudden death episodes.  This is not one of the phenotypes strongly associated with genetics.  I have recommended family screening nonetheless.  4. Essential hypertension -Well-controlled today.  Continue current medications.  5. Tobacco abuse -Still vaping.  Advised to quit.  Disposition: Return in about 6 months (around 09/23/2020).  Medication Adjustments/Labs and Tests Ordered: Current medicines are reviewed at length with the patient today.  Concerns regarding medicines are outlined above.  No orders of the defined types were placed in this encounter.  Meds ordered this encounter  Medications   apixaban (ELIQUIS) 5 MG TABS tablet    Sig: Take 1 tablet (5 mg total) by mouth 2 (two) times daily.    Dispense:  60 tablet    Refill:  11   metoprolol tartrate (LOPRESSOR) 100 MG tablet    Sig: Take 2 tablets (200 mg total) by mouth 2 (two) times daily.    Dispense:  180 tablet    Refill:  3   torsemide (DEMADEX) 20 MG tablet    Sig: Take 1 tablet (20 mg total) by mouth daily.    Dispense:  180 tablet    Refill:  3    Patient Instructions  Medication Instructions:  The current medical regimen is effective;  continue present plan and medications.  *If you need a refill on your cardiac medications before your next appointment,  please call your pharmacy*  Follow-Up: At [facility], you and your health needs are our priority.  As part of our continuing mission to provide you with exceptional heart care, we have created designated Provider Care Teams.  These Care Teams include your primary Cardiologist (physician) and Advanced Practice Providers (APPs -  Physician Assistants and Nurse Practitioners) who all work together to provide you with the care you need, when you need it.  We recommend signing up for the patient portal called "MyChart".  Sign up information is provided on this After Visit Summary.  MyChart is used to connect with patients for Virtual Visits (Telemedicine).  Patients are able to view lab/test results, encounter notes, upcoming appointments, etc.  Non-urgent messages can be sent to your provider as well.   To learn more about what you can do with MyChart, go to [email or web address].    Your next appointment:   6 month(s)  The format for your next appointment:   In Person  Provider:   Eleonore Chiquito, MD       Time Spent with Patient: I have spent a total of 25 minutes with patient reviewing hospital notes, telemetry, EKGs, labs and examining the patient as well as establishing an assessment and plan that was discussed with the patient.  > 50% of time was spent in direct patient care.  Signed, Addison Naegeli. Audie Box, [place]  [address], [place], [place] [address] [phone]  03/26/2020 4:21 PM

## 2020-03-26 ENCOUNTER — Encounter: Payer: Self-pay | Admitting: Cardiovascular Disease

## 2020-03-26 ENCOUNTER — Other Ambulatory Visit: Payer: Self-pay

## 2020-03-26 ENCOUNTER — Ambulatory Visit (INDEPENDENT_AMBULATORY_CARE_PROVIDER_SITE_OTHER): Payer: Medicare Other | Admitting: Cardiovascular Disease

## 2020-03-26 VITALS — BP 124/66 | HR 102 | Ht 64.0 in

## 2020-03-26 DIAGNOSIS — I4819 Other persistent atrial fibrillation: Secondary | ICD-10-CM | POA: Diagnosis not present

## 2020-03-26 DIAGNOSIS — I10 Essential (primary) hypertension: Secondary | ICD-10-CM | POA: Diagnosis not present

## 2020-03-26 DIAGNOSIS — I422 Other hypertrophic cardiomyopathy: Secondary | ICD-10-CM

## 2020-03-26 DIAGNOSIS — I5032 Chronic diastolic (congestive) heart failure: Secondary | ICD-10-CM | POA: Diagnosis not present

## 2020-03-26 DIAGNOSIS — Z720 Tobacco use: Secondary | ICD-10-CM

## 2020-03-26 MED ORDER — METOPROLOL TARTRATE 100 MG PO TABS: 200.0000 mg | ORAL_TABLET | Freq: Two times a day (BID) | ORAL | 3 refills | Status: DC

## 2020-03-26 MED ORDER — TORSEMIDE 20 MG PO TABS: 20.0000 mg | ORAL_TABLET | Freq: Every day | ORAL | 3 refills | Status: DC

## 2020-03-26 MED ORDER — APIXABAN 5 MG PO TABS: 5.0000 mg | ORAL_TABLET | Freq: Two times a day (BID) | ORAL | 11 refills | Status: DC

## 2020-03-26 NOTE — Patient Instructions (Signed)
Medication Instructions:  The current medical regimen is effective;  continue present plan and medications.  *If you need a refill on your cardiac medications before your next appointment, please call your pharmacy*   Follow-Up: At [facility], you and your health needs are our priority.  As part of our continuing mission to provide you with exceptional heart care, we have created designated Provider Care Teams.  These Care Teams include your primary Cardiologist (physician) and Advanced Practice Providers (APPs -  Physician Assistants and Nurse Practitioners) who all work together to provide you with the care you need, when you need it.  We recommend signing up for the patient portal called "MyChart".  Sign up information is provided on this After Visit Summary.  MyChart is used to connect with patients for Virtual Visits (Telemedicine).  Patients are able to view lab/test results, encounter notes, upcoming appointments, etc.  Non-urgent messages can be sent to your provider as well.   To learn more about what you can do with MyChart, go to [email or web address].    Your next appointment:   6 month(s)  The format for your next appointment:   In Person  Provider:   Lykens O'Neal, MD      

## 2020-03-31 DIAGNOSIS — H2513 Age-related nuclear cataract, bilateral: Secondary | ICD-10-CM | POA: Diagnosis not present

## 2020-03-31 DIAGNOSIS — H25013 Cortical age-related cataract, bilateral: Secondary | ICD-10-CM | POA: Diagnosis not present

## 2020-03-31 DIAGNOSIS — E119 Type 2 diabetes mellitus without complications: Secondary | ICD-10-CM | POA: Diagnosis not present

## 2020-03-31 LAB — HM DIABETES EYE EXAM

## 2020-04-02 ENCOUNTER — Encounter: Payer: Self-pay | Admitting: Internal Medicine

## 2020-04-02 ENCOUNTER — Encounter: Payer: Self-pay | Admitting: Pulmonary Disease

## 2020-04-02 ENCOUNTER — Ambulatory Visit (INDEPENDENT_AMBULATORY_CARE_PROVIDER_SITE_OTHER): Payer: Medicare Other | Admitting: Pulmonary Disease

## 2020-04-02 ENCOUNTER — Other Ambulatory Visit: Payer: Self-pay

## 2020-04-02 VITALS — BP 124/72 | HR 121 | Temp 98.6°F | Ht 64.0 in

## 2020-04-02 DIAGNOSIS — J449 Chronic obstructive pulmonary disease, unspecified: Secondary | ICD-10-CM | POA: Diagnosis not present

## 2020-04-02 MED ORDER — TRELEGY ELLIPTA 100-62.5-25 MCG/INH IN AEPB: 1.0000 | INHALATION_SPRAY | Freq: Every day | RESPIRATORY_TRACT | 5 refills | Status: DC

## 2020-04-02 MED ORDER — TRELEGY ELLIPTA 100-62.5-25 MCG/INH IN AEPB
1.0000 | INHALATION_SPRAY | Freq: Every day | RESPIRATORY_TRACT | 5 refills | Status: DC
Start: 2020-04-02 — End: 2020-04-02

## 2020-04-02 MED ORDER — ALBUTEROL SULFATE HFA 108 (90 BASE) MCG/ACT IN AERS: 1.0000 | INHALATION_SPRAY | RESPIRATORY_TRACT | 3 refills | Status: DC | PRN

## 2020-04-02 MED ORDER — ALBUTEROL SULFATE HFA 108 (90 BASE) MCG/ACT IN AERS: 1.0000 | INHALATION_SPRAY | RESPIRATORY_TRACT | 3 refills | Status: AC | PRN

## 2020-04-02 NOTE — Progress Notes (Signed)
Subjective:   PATIENT ID: Becky Stanley GENDER: female DOB: 1946/01/01, MRN: [id number]   HPI  Chief Complaint  Patient presents with  . Follow-up    SOB and cough with occasional clear sputum.    Reason for Visit: Follow-up COPD  [name] is a 74 year old female with HCM, chronic diastolic heart failure, atrial fibrillation who presents for follow-up.  Synopsis: She was previously followed by Pulmonology in [place]. She was diagnosed with COPD three years ago when she was hospitalized. She is currently on Dulera 200-5 mcg TWO puffs TWICE a day. She notices that medication runs out. She has shortness of breath, wheezing and mixed cough worsened with talking and movement. She has chronic chest congestion. Reports at least a 30 pack-year history and quit 20 years ago however started vaping 5 years ago due to stressors. She is interested in quitting but acknowledges that she is having difficulty with motivation. Not able to take wellbutrin (caused reflux) and not interested in chantix (heard about hallucinations/agitation)   Interval  She has been compliant withTrelegy. She has provided documentation for [facility] assistance. She reports unchanged shortness of breath with exertion. Only has wheezing when she has not taken inhaler yet or late in the night. Denies COPD exacerbations requiring steroids. Continues to vape.  I have personally reviewed patient's past medical/family/social history/allergies/current medications.  Past Medical History:  Diagnosis Date  . Anxiety and depression   . Carpal tunnel syndrome, left   . Chronic UTI, h/o   . COPD (chronic obstructive pulmonary disease) ([place])   . Diabetes mellitus ([place])   . DJD (degenerative joint disease)   . Essential hypertension   . GERD (gastroesophageal reflux disease)   . Hyperlipidemia   . Hypertrophic cardiomyopathy ([place])   . Iron deficiency anemia   . Vitamin D deficiency      Outpatient Medications Prior to Visit   Medication Sig Dispense Refill  . albuterol (ACCUNEB) 0.63 MG/3ML nebulizer solution Take 3 mLs (0.63 mg total) by nebulization 4 (four) times daily as needed for wheezing or shortness of breath. 75 mL 2  . alum & mag hydroxide-simeth (MAALOX/MYLANTA) 200-200-20 MG/5ML suspension Take 15 mLs by mouth every 6 (six) hours as needed for indigestion or heartburn.    Marland Kitchen apixaban (ELIQUIS) 5 MG TABS tablet Take 1 tablet (5 mg total) by mouth 2 (two) times daily. 60 tablet 11  . clonazePAM (KLONOPIN) 1 MG tablet TAKE 1 TABLET (1 MG TOTAL) BY MOUTH 2 (TWO) TIMES DAILY AS NEEDED FOR ANXIETY. 60 tablet 2  . esomeprazole (NEXIUM) 20 MG capsule Take 20 mg by mouth 2 (two) times daily before a meal.    . glimepiride (AMARYL) 4 MG tablet Take 1/2 tablet every morning and 1 tablet by mouth at night 135 tablet 0  . glucose blood (ONETOUCH VERIO) test strip Check blood sugar once daily 100 each 12  . HYDROcodone-acetaminophen (NORCO/VICODIN) 5-325 MG tablet Take 1 tablet by mouth every 6 (six) hours as needed for moderate pain.    Marland Kitchen levofloxacin (LEVAQUIN) 250 MG tablet Take 250 mg by mouth daily as needed.     . lovastatin (MEVACOR) 40 MG tablet Take 1 tablet (40 mg total) by mouth at bedtime. 90 tablet 1  . metFORMIN (GLUCOPHAGE-XR) 500 MG 24 hr tablet Take 2 tablets (1,000 mg total) by mouth 2 (two) times daily. 360 tablet 1  . metoprolol tartrate (LOPRESSOR) 100 MG tablet Take 2 tablets (200 mg total) by mouth 2 (two)  times daily. 180 tablet 3  . mupirocin ointment (BACTROBAN) 2 % Apply 1 application topically 2 (two) times daily as needed. 22 g 0  . nystatin (MYCOSTATIN) 100000 UNIT/ML suspension Take 5 mLs (500,000 Units total) by mouth 4 (four) times daily. 473 mL 0  . OneTouch Delica Lancets 01X MISC Check blood sugars once daily 100 each 12  . Probiotic Product (PROBIOTIC-10 PO) Take 1 capsule by mouth daily.    . sertraline (ZOLOFT) 100 MG tablet TAKE 1 AND 1/2 TABLETS BY MOUTH EVERY DAY 135 tablet 2  .  sulfamethoxazole-trimethoprim (BACTRIM DS) 800-160 MG tablet Take 1 tablet by mouth 2 (two) times daily. 10 tablet 0  . torsemide (DEMADEX) 20 MG tablet Take 1 tablet (20 mg total) by mouth daily. 180 tablet 3  . Fluticasone-Umeclidin-Vilant (TRELEGY ELLIPTA) 100-62.5-25 MCG/INH AEPB Inhale 1 puff into the lungs daily. 60 each 5   No facility-administered medications prior to visit.    Review of Systems  Constitutional: Negative for chills, diaphoresis, fever, malaise/fatigue and weight loss.  HENT: Negative for congestion.   Respiratory: Positive for shortness of breath and wheezing. Negative for cough, hemoptysis and sputum production.   Cardiovascular: Negative for chest pain, palpitations and leg swelling.   Objective:   Vitals:   04/02/20 1444  BP: 124/72  Pulse: (!) 121  Temp: 98.6 F (37 C)  TempSrc: Oral  SpO2: 95%  Height: 5\' 4"  (1.626 m)   SpO2: 95 % O2 Device: None (Room air)  Physical Exam: General: Well-appearing, no acute distress HENT: [place], AT Eyes: EOMI, no scleral icterus Respiratory: Clear to auscultation bilaterally.  No crackles, wheezing or rales Cardiovascular: RRR, -M/R/G, no JVD GI: BS+, soft, nontender Extremities:-Edema,-tenderness Neuro: AAO x4, CNII-XII grossly intact Skin: Intact, no rashes or bruising Psych: Normal mood, normal affect  Data Reviewed:  Imaging: CXR 06/15/2019 - No infiltrate, effusion or edema. Minimal lingula atelectasis  PFT: None on file     Assessment & Plan:   Discussion: 74 year old female active smoker with COPD of unknown severity with chronic bronchitis, HCM, chronic diastolic heart failure and atrial fibrillation who presents for follow-up. Compliant with therapy. Remains somewhat symptomatic but improved compared to when not on triple therapy. Not in active exacerbation  COPD - uncontrolled but improved --CONTINUE Trelegy 100-62.5-25 ONE puff ONCE a day. REFILL --CONTINUE Albuterol as needed shortness of  breath or wheezing. REFILL. --Recommended Pulmonary Rehab. Declined. Will re-address at next visit  Tobacco abuse Patient is an active smoker (vaping) We discussed smoking cessation for 5 minutes. We discussed triggers and stressors and ways to deal with them. We discussed barriers to continued smoking and benefits of smoking cessation.    Health Maintenance Immunization History  Administered Date(s) Administered  . Influenza, High Dose Seasonal PF 06/20/2019  . Influenza-Unspecified 05/16/2006, 04/17/2009, 06/01/2010  . PFIZER SARS-COV-2 Vaccination 09/09/2019, 10/09/2019  . Pneumococcal-Unspecified 03/30/2007  Influenza vaccine due. Will pursue at pharmacy.   CT Lung Screen - will discuss at next visit. Declined this time  No orders of the defined types were placed in this encounter.  Meds ordered this encounter  Medications  . Fluticasone-Umeclidin-Vilant (TRELEGY ELLIPTA) 100-62.5-25 MCG/INH AEPB    Sig: Inhale 1 puff into the lungs daily.    Dispense:  60 each    Refill:  5  . albuterol (VENTOLIN HFA) 108 (90 Base) MCG/ACT inhaler    Sig: Inhale 1-2 puffs into the lungs every 4 (four) hours as needed for wheezing or shortness of breath.  Dispense:  1 each    Refill:  3    Return in about 6 months (around 09/30/2020).   I have spent a total time of 31-minutes on the day of the appointment reviewing prior documentation, coordinating care and discussing medical diagnosis and plan with the patient/family. Imaging, labs and tests included in this note have been reviewed and interpreted independently by me.  [place], MD [facility] 04/02/2020 3:47 PM  Office Number [phone]

## 2020-04-02 NOTE — Patient Instructions (Addendum)
COPD --CONTINUE Trelegy 100-62.5-25 ONE puff ONCE a day. REFILL --CONTINUE Albuterol as needed shortness of breath or wheezing. REFILL.  Follow-up with me in 6 months

## 2020-04-09 ENCOUNTER — Ambulatory Visit: Payer: Medicare Other | Admitting: Internal Medicine

## 2020-04-15 ENCOUNTER — Other Ambulatory Visit: Payer: Self-pay

## 2020-04-15 ENCOUNTER — Ambulatory Visit (INDEPENDENT_AMBULATORY_CARE_PROVIDER_SITE_OTHER): Payer: Medicare Other | Admitting: Internal Medicine

## 2020-04-15 ENCOUNTER — Encounter: Payer: Self-pay | Admitting: Internal Medicine

## 2020-04-15 VITALS — BP 110/72 | HR 65 | Temp 98.5°F | Resp 18 | Ht 64.0 in

## 2020-04-15 DIAGNOSIS — M549 Dorsalgia, unspecified: Secondary | ICD-10-CM | POA: Diagnosis not present

## 2020-04-15 DIAGNOSIS — E118 Type 2 diabetes mellitus with unspecified complications: Secondary | ICD-10-CM | POA: Diagnosis not present

## 2020-04-15 DIAGNOSIS — R6889 Other general symptoms and signs: Secondary | ICD-10-CM

## 2020-04-15 DIAGNOSIS — I10 Essential (primary) hypertension: Secondary | ICD-10-CM | POA: Diagnosis not present

## 2020-04-15 DIAGNOSIS — I421 Obstructive hypertrophic cardiomyopathy: Secondary | ICD-10-CM

## 2020-04-15 NOTE — Progress Notes (Signed)
Subjective:    Patient ID: Becky Stanley, female    DOB: 15-Dec-1945, 74 y.o.   MRN: [id number]  DOS:  04/15/2020 Type of visit - description: Routine checkup Since the last office visit, she saw pulmonary and cardiology, notes reviewed.  Wonders about difficulty with memories, she is forgetful. She denies getting lost or something serious like that, still able to pay her bills. No EtOH, mood is well controlled.  Also 2 months history of ache at the left mid back, steady, no worse with deep breathing or movement but sometimes increases when she leans on that side.  Wonders about her kidney, denies gross hematuria.  No rash or injury.    Review of Systems See above   Past Medical History:  Diagnosis Date  . Anxiety and depression   . Carpal tunnel syndrome, left   . Chronic UTI, h/o   . COPD (chronic obstructive pulmonary disease) ([place])   . Diabetes mellitus ([place])   . DJD (degenerative joint disease)   . Essential hypertension   . GERD (gastroesophageal reflux disease)   . Hyperlipidemia   . Hypertrophic cardiomyopathy ([place])   . Iron deficiency anemia   . Vitamin D deficiency     Past Surgical History:  Procedure Laterality Date  . CHOLECYSTECTOMY    . ESOPHAGEAL DILATION     multiple procedures per Pt  . TOTAL HIP ARTHROPLASTY Bilateral ~2010 ??    Allergies as of 04/15/2020      Reactions   Aminoglycosides    Ciprofloxacin Nausea And Vomiting   Tolerates levaquin   Erythromycin Nausea And Vomiting   Nitrofurantoin Nausea And Vomiting   Other Nausea And Vomiting   Mussels   Penicillins Hives      Medication List       Accurate as of April 15, 2020 11:59 PM. If you have any questions, ask your nurse or doctor.        STOP taking these medications   sulfamethoxazole-trimethoprim 800-160 MG tablet Commonly known as: BACTRIM DS Stopped by: Kathlene November, MD     TAKE these medications   albuterol 0.63 MG/3ML nebulizer solution Commonly known as:  ACCUNEB Take 3 mLs (0.63 mg total) by nebulization 4 (four) times daily as needed for wheezing or shortness of breath.   albuterol 108 (90 Base) MCG/ACT inhaler Commonly known as: Ventolin HFA Inhale 1-2 puffs into the lungs every 4 (four) hours as needed for wheezing or shortness of breath.   alum & mag hydroxide-simeth 200-200-20 MG/5ML suspension Commonly known as: MAALOX/MYLANTA Take 15 mLs by mouth every 6 (six) hours as needed for indigestion or heartburn.   apixaban 5 MG Tabs tablet Commonly known as: Eliquis Take 1 tablet (5 mg total) by mouth 2 (two) times daily.   clonazePAM 1 MG tablet Commonly known as: KLONOPIN TAKE 1 TABLET (1 MG TOTAL) BY MOUTH 2 (TWO) TIMES DAILY AS NEEDED FOR ANXIETY.   esomeprazole 20 MG capsule Commonly known as: NEXIUM Take 20 mg by mouth 2 (two) times daily before a meal.   glimepiride 4 MG tablet Commonly known as: AMARYL Take 1/2 tablet every morning and 1 tablet by mouth at night   HYDROcodone-acetaminophen 5-325 MG tablet Commonly known as: NORCO/VICODIN Take 1 tablet by mouth every 6 (six) hours as needed for moderate pain.   levofloxacin 250 MG tablet Commonly known as: LEVAQUIN Take 250 mg by mouth daily as needed.   lovastatin 40 MG tablet Commonly known as: MEVACOR Take 1 tablet (40  mg total) by mouth at bedtime.   metFORMIN 500 MG 24 hr tablet Commonly known as: GLUCOPHAGE-XR Take 2 tablets (1,000 mg total) by mouth 2 (two) times daily.   metoprolol tartrate 100 MG tablet Commonly known as: LOPRESSOR Take 2 tablets (200 mg total) by mouth 2 (two) times daily.   mupirocin ointment 2 % Commonly known as: BACTROBAN Apply 1 application topically 2 (two) times daily as needed.   nystatin 100000 UNIT/ML suspension Commonly known as: MYCOSTATIN Take 5 mLs (500,000 Units total) by mouth 4 (four) times daily.   OneTouch Delica Lancets 71G Misc Check blood sugars once daily   OneTouch Verio test strip Generic drug:  glucose blood Check blood sugar once daily   PROBIOTIC-10 PO Take 1 capsule by mouth daily.   sertraline 100 MG tablet Commonly known as: ZOLOFT TAKE 1 AND 1/2 TABLETS BY MOUTH EVERY DAY   torsemide 20 MG tablet Commonly known as: DEMADEX Take 1 tablet (20 mg total) by mouth daily.   Trelegy Ellipta 100-62.5-25 MCG/INH Aepb Generic drug: Fluticasone-Umeclidin-Vilant Inhale 1 puff into the lungs daily.          Objective:   Physical Exam Musculoskeletal:       Arms:    BP 110/72 (BP Location: Right Wrist, Patient Position: Sitting, Cuff Size: Small)   Pulse 65   Temp 98.5 F (36.9 C) (Oral)   Resp 18   Ht 5\' 4"  (1.626 m)   SpO2 98%  General:   Well developed, NAD, BMI noted. HEENT:  Normocephalic . Face symmetric, atraumatic Lungs:  CTA B Normal respiratory effort, no intercostal retractions, no accessory muscle use. Heart: Irregularly irregular,  no murmur.  Lower extremities: no pretibial edema bilaterally  Skin: Not pale. Not jaundice Neurologic:  alert & oriented X3.  Speech normal, gait not tested, uses a scooter Psych--  Cognition and judgment appear intact.  Cooperative with normal attention span and concentration.  Behavior appropriate. No anxious or depressed appearing.      Assessment      ASSESSMENT (new patient 10-2018 referred by her daughter [name]) DM HTN High cholesterol Alcoholic sober since 6269, [place] meetings  Anxiety: On sertraline, clonazepam Pulmonary: Asthma, COPD, smoker/vaping ----admitted 06/2017: Acute hypoxic respiratory failure ----CT of the chest in August 2017 which revealed mild groundglass changes primarily in the upper lobes with no noted masses, nodules, or lesions Iron deficiency anemia CV: -CHF DX 06/2017 -HCOM ( avoid excessive diuresis) -Atrial fibrillation, new DX 07/27/2019 -- LE edema L>R DJD Migraines: On hydrocodone as needed Recurrent UTIs: At some point saw infectious diseases, was Rx Levaquin for 3  days as needed.  She is allergic to Cipro but tolerates Levaquin.  As of 10-2018, UTIs are less frequent. Uses a scooter since hip surgery ~ 2010  PLAN: SW:NIOEVOJJ Metformin, glimepiride, check A1c. HTN: Seems well controlled, continue metoprolol, Demadex.  Check a BMP Asthma, COPD: Saw pulmonary 04/02/2020, she was felt to be uncontrolled but improving. Still smoking.  Cessation discussed. H COM, A. fib: Saw cardiology 03/26/2020, no change meds, vaping cessation discussed as well.  She is not ready to quit just yet. Pain, left mid back: As described above, no radiation, last chest x-ray okay, no new respiratory symptoms, no gross hematuria.  We agreed on observation for now, if symptoms increase or not better let me know. Forgetfulness: She is concerned about her memory, she is alert oriented x3, highly functional.  Recommend observation. Preventive care: Strongly declines a Covid vaccine booster, discussed significant  benefits, she still declines. Plans to get a flu shot at her local pharmacy. RTC 4 months  Time spent with the patient today 35 minutes.  More than 50% of the time spent reviewing her chart, counseling her about benefits of Covid vaccination, assessing her forgetfulness and reassuring her that there is no evidence of dementia.   This visit occurred during the SARS-CoV-2 public health emergency.  Safety protocols were in place, including screening questions prior to the visit, additional usage of staff PPE, and extensive cleaning of exam room while observing appropriate contact time as indicated for disinfecting solutions.

## 2020-04-15 NOTE — Patient Instructions (Addendum)
Check the  blood pressure weekly. BP GOAL is between 110/65 and  135/85. If it is consistently higher or lower, let me know  Proceed with your influenza vaccination at your earliest [facility] : Get the blood work     Becky Stanley, Becky Stanley back for a checkup in 4 months

## 2020-04-15 NOTE — Progress Notes (Signed)
Pre visit review using our clinic review tool, if applicable. No additional management support is needed unless otherwise documented below in the visit note. 

## 2020-04-16 LAB — BASIC METABOLIC PANEL
BUN: 20 mg/dL (ref 7–25)
CO2: 24 mmol/L (ref 20–32)
Calcium: 9.3 mg/dL (ref 8.6–10.4)
Chloride: 100 mmol/L (ref 98–110)
Creat: 0.92 mg/dL (ref 0.60–0.93)
Glucose, Bld: 94 mg/dL (ref 65–99)
Potassium: 4.4 mmol/L (ref 3.5–5.3)
Sodium: 137 mmol/L (ref 135–146)

## 2020-04-16 LAB — HEMOGLOBIN A1C
Hgb A1c MFr Bld: 6.6 % of total Hgb — ABNORMAL HIGH (ref <5.7)
Mean Plasma Glucose: 143 (calc)
eAG (mmol/L): 7.9 (calc)

## 2020-04-16 NOTE — Assessment & Plan Note (Signed)
XM:IWOEHOZY Metformin, glimepiride, check A1c. HTN: Seems well controlled, continue metoprolol, Demadex.  Check a BMP Asthma, COPD: Saw pulmonary 04/02/2020, she was felt to be uncontrolled but improving. Still smoking.  Cessation discussed. H COM, A. fib: Saw cardiology 03/26/2020, no change meds, vaping cessation discussed as well.  She is not ready to quit just yet. Pain, left mid back: As described above, no radiation, last chest x-ray okay, no new respiratory symptoms, no gross hematuria.  We agreed on observation for now, if symptoms increase or not better let me know. Forgetfulness: She is concerned about her memory, she is alert oriented x3, highly functional.  Recommend observation. Preventive care: Strongly declines a Covid vaccine booster, discussed significant benefits, she still declines. Plans to get a flu shot at her local pharmacy. RTC 4 months

## 2020-05-14 ENCOUNTER — Telehealth: Payer: Self-pay | Admitting: Internal Medicine

## 2020-05-14 MED ORDER — TERBINAFINE HCL 1 % EX CREA: 1.0000 "application " | TOPICAL_CREAM | Freq: Two times a day (BID) | CUTANEOUS | 0 refills | Status: DC

## 2020-05-14 NOTE — Telephone Encounter (Signed)
Patient states she need some yeast cream to put under her breast and some parts very close to vagina. Patient is unable to scheduled appt due to transportation.   [id number] [facility], [place] - [address] [place] [address]  Phone:  [phone] Fax:  [phone]

## 2020-05-14 NOTE — Telephone Encounter (Signed)
Please advise 

## 2020-05-14 NOTE — Telephone Encounter (Signed)
Advise patient: I sent a prescription but if she is not better needs to be seen in person

## 2020-05-14 NOTE — Telephone Encounter (Signed)
Pt informed

## 2020-05-29 ENCOUNTER — Telehealth: Payer: Self-pay | Admitting: Cardiovascular Disease

## 2020-05-29 NOTE — Telephone Encounter (Signed)
We need to let the dentist know how long this patient should hold Eliquis pre dental extraction.  It sounds like it will be more than one tooth removed.  Thanks  Kerin Ransom PA-C 05/29/2020 2:09 PM

## 2020-05-29 NOTE — Telephone Encounter (Addendum)
   Primary Cardiologist: Evalina Field, MD  Chart reviewed as part of pre-operative protocol coverage. Given past medical history and time since last visit, based on ACC/AHA guidelines, Becky Stanley would be at acceptable risk for the planned procedure without further cardiovascular testing.   OK to hold Eliquis one day pre op if needed, resume as soon as safe post op. This patient does not need SBE prophylaxis.   The patient was advised that if she develops new symptoms prior to surgery to contact our office to arrange for a follow-up visit, and she verbalized understanding.  I will route this recommendation to the requesting party via Epic fax function and remove from pre-op pool.  Please call with questions.  Kerin Ransom, PA-C 05/29/2020, 3:35 PM

## 2020-05-29 NOTE — Telephone Encounter (Signed)
1. What dental office are you calling from? [facility]  2. What is your office phone number? [phone]  3. What is your fax number? [phone]  4. What type of procedure is the patient having performed? 2 teeth extracted, not sure until she come into the office  5. What date is procedure scheduled or is the patient there now? 06/11/2020 (if the patient is at the dentist's office question goes to their cardiologist if he/she is in the office.  If not, question should go to the DOD).   6. What is your question (ex. Antibiotics prior to procedure, holding medication-we need to know how long dentist wants pt to hold med)? Caryl Pina from [facility] states the office is requesting to know if the patient can have 2 teeth extracted. They would like to know if there are any pre medications she will need to take. She states the patient has not yet been seen so they are unsure how many teeth need to be removed. The patient herself is requesting 2. Email: [email or web address]

## 2020-05-29 NOTE — Telephone Encounter (Signed)
Patient with diagnosis of afib on Eliquis for anticoagulation.    Procedure: 2 teeth extracted, not sure until she come into the office Date of procedure: 06/11/20    CHA2DS2-VASc Score = 5  This indicates a 7.2% annual risk of stroke. The patient's score is based upon: CHF History: 1 HTN History: 1 Diabetes History: 1 Stroke History: 0 Vascular Disease History: 0 Age Score: 1 Gender Score: 1      Cannot calculate crcl due to pt refusing weight. Ideal BW crcl is 43 ml/min  Per office protocol, patient can hold Elqiuis for 1 day prior to procedure.

## 2020-06-07 ENCOUNTER — Other Ambulatory Visit: Payer: Self-pay | Admitting: Internal Medicine

## 2020-06-11 ENCOUNTER — Other Ambulatory Visit: Payer: Self-pay

## 2020-06-11 ENCOUNTER — Encounter: Payer: Self-pay | Admitting: Internal Medicine

## 2020-06-11 ENCOUNTER — Telehealth (INDEPENDENT_AMBULATORY_CARE_PROVIDER_SITE_OTHER): Payer: Medicare Other | Admitting: Internal Medicine

## 2020-06-11 VITALS — Ht 64.0 in

## 2020-06-11 DIAGNOSIS — N760 Acute vaginitis: Secondary | ICD-10-CM

## 2020-06-11 DIAGNOSIS — B370 Candidal stomatitis: Secondary | ICD-10-CM | POA: Diagnosis not present

## 2020-06-11 MED ORDER — FLUCONAZOLE 150 MG PO TABS: 150.0000 mg | ORAL_TABLET | Freq: Every day | ORAL | 0 refills | Status: DC

## 2020-06-11 MED ORDER — NYSTATIN 100000 UNIT/ML MT SUSP
5.0000 mL | Freq: Four times a day (QID) | OROMUCOSAL | 0 refills | Status: DC
Start: 2020-06-11 — End: 2020-08-20

## 2020-06-11 NOTE — Progress Notes (Signed)
Subjective:    Patient ID: Becky Stanley, female    DOB: Nov 12, 1945, 74 y.o.   MRN: [id number]  DOS:  06/11/2020 Type of visit - description: Virtual Visit via Video Note  I connected with the above patient  by a video enabled telemedicine application and verified that I am speaking with the correct person using two identifiers.   THIS ENCOUNTER IS A VIRTUAL VISIT DUE TO COVID-19 - PATIENT WAS NOT SEEN IN THE OFFICE. PATIENT HAS CONSENTED TO VIRTUAL VISIT / TELEMEDICINE VISIT   Location of patient: home  Location of provider: office  Persons participating in the virtual visit: patient, provider   I discussed the limitations of evaluation and management by telemedicine and the availability of in person appointments. The patient expressed understanding and agreed to proceed.  Acute The patient reports thrush 4 months, "never goes away completely". Reports a burning feeling in the mouth, worsening with food. I ask about ulcers or white patches at her throat and she denies.  Also, reports itching on a raw feeling at the vaginal area, she also thinks she has had yeast infection there. She applied Monistat and a couple of days and that felt better. The skin there is  slightly warm but she does not know if it is red.  Review of Systems See above   Past Medical History:  Diagnosis Date   Anxiety and depression    Carpal tunnel syndrome, left    Chronic UTI, h/o    COPD (chronic obstructive pulmonary disease) ([facility])    Diabetes mellitus ([facility])    DJD (degenerative joint disease)    Essential hypertension    GERD (gastroesophageal reflux disease)    Hyperlipidemia    Hypertrophic cardiomyopathy ([facility])    Iron deficiency anemia    Vitamin D deficiency     Past Surgical History:  Procedure Laterality Date   CHOLECYSTECTOMY     ESOPHAGEAL DILATION     multiple procedures per Pt   TOTAL HIP ARTHROPLASTY Bilateral ~2010 ??    Allergies as of 06/11/2020       Reactions   Aminoglycosides    Ciprofloxacin Nausea And Vomiting   Tolerates levaquin   Erythromycin Nausea And Vomiting   Nitrofurantoin Nausea And Vomiting   Other Nausea And Vomiting   Mussels   Penicillins Hives      Medication List       Accurate as of June 11, 2020 11:59 PM. If you have any questions, ask your nurse or doctor.        albuterol 0.63 MG/3ML nebulizer solution Commonly known as: ACCUNEB Take 3 mLs (0.63 mg total) by nebulization 4 (four) times daily as needed for wheezing or shortness of breath.   albuterol 108 (90 Base) MCG/ACT inhaler Commonly known as: Ventolin HFA Inhale 1-2 puffs into the lungs every 4 (four) hours as needed for wheezing or shortness of breath.   alum & mag hydroxide-simeth 200-200-20 MG/5ML suspension Commonly known as: MAALOX/MYLANTA Take 15 mLs by mouth every 6 (six) hours as needed for indigestion or heartburn.   apixaban 5 MG Tabs tablet Commonly known as: Eliquis Take 1 tablet (5 mg total) by mouth 2 (two) times daily.   clonazePAM 1 MG tablet Commonly known as: KLONOPIN TAKE 1 TABLET (1 MG TOTAL) BY MOUTH 2 (TWO) TIMES DAILY AS NEEDED FOR ANXIETY.   esomeprazole 20 MG capsule Commonly known as: NEXIUM Take 20 mg by mouth 2 (two) times daily before a meal.   fluconazole 150  MG tablet Commonly known as: DIFLUCAN Take 1 tablet (150 mg total) by mouth daily. Started by: Kathlene November, MD   glimepiride 4 MG tablet Commonly known as: AMARYL TAKE 1/2 TABLET EVERY MORNING AND 1 TABLET BY MOUTH AT NIGHT   HYDROcodone-acetaminophen 5-325 MG tablet Commonly known as: NORCO/VICODIN Take 1 tablet by mouth every 6 (six) hours as needed for moderate pain.   levofloxacin 250 MG tablet Commonly known as: LEVAQUIN Take 250 mg by mouth daily as needed.   lovastatin 40 MG tablet Commonly known as: MEVACOR Take 1 tablet (40 mg total) by mouth at bedtime.   metFORMIN 500 MG 24 hr tablet Commonly known as: GLUCOPHAGE-XR Take 2  tablets (1,000 mg total) by mouth 2 (two) times daily.   metoprolol tartrate 100 MG tablet Commonly known as: LOPRESSOR Take 2 tablets (200 mg total) by mouth 2 (two) times daily.   mupirocin ointment 2 % Commonly known as: BACTROBAN Apply 1 application topically 2 (two) times daily as needed.   nystatin 100000 UNIT/ML suspension Commonly known as: MYCOSTATIN Take 5 mLs (500,000 Units total) by mouth 4 (four) times daily. For 5 days What changed: additional instructions Changed by: Kathlene November, MD   OneTouch Delica Lancets 62X Misc Check blood sugars once daily   OneTouch Verio test strip Generic drug: glucose blood Check blood sugar once daily   PROBIOTIC-10 PO Take 1 capsule by mouth daily.   sertraline 100 MG tablet Commonly known as: ZOLOFT TAKE 1 AND 1/2 TABLETS BY MOUTH EVERY DAY   terbinafine 1 % cream Commonly known as: LAMISIL Apply 1 application topically 2 (two) times daily.   torsemide 20 MG tablet Commonly known as: DEMADEX Take 1 tablet (20 mg total) by mouth daily.   Trelegy Ellipta 100-62.5-25 MCG/INH Aepb Generic drug: Fluticasone-Umeclidin-Vilant Inhale 1 puff into the lungs daily.          Objective:   Physical Exam Ht 5\' 4"  (1.626 m)  This is a video conference, she is alert oriented x3, does not seem to be in distress.  No vital signs available.    Assessment      ASSESSMENT (new patient 10-2018 referred by her daughter Becky Stanley) DM HTN High cholesterol Alcoholic sober since 5284, [place] meetings  Anxiety: On sertraline, clonazepam Pulmonary: Asthma, COPD, smoker/vaping ----admitted 06/2017: Acute hypoxic respiratory failure ----CT of the chest in August 2017 which revealed mild groundglass changes primarily in the upper lobes with no noted masses, nodules, or lesions Iron deficiency anemia CV: -CHF DX 06/2017 -HCOM ( avoid excessive diuresis) -Atrial fibrillation, new DX 07/27/2019 -- LE edema L>R DJD Migraines: On hydrocodone as  needed Recurrent UTIs: At some point saw infectious diseases, was Rx Levaquin for 3 days as needed.  She is allergic to Cipro but tolerates Levaquin.  As of 10-2018, UTIs are less frequent. Uses a scooter since hip surgery ~ 2010  PLAN: Thrush?  Vaginal yeast infection? I advised patient of the limitation of this visit as I cannot examine her.   Although she could have thrush and a vaginal yeast infection, although etiology are possible: Stomatitis, cellulitis near the vaginal area. For that reason we agreed that if she is not responding quickly to treatment she will need to seek medical attention in person. She uses Trelegy for asthma COPD, I encouraged her to rinse after every use and she says she does. Plan: Diflucan 150 mg for 2 days Nystatin 4 times a day for 5 days If she is not improving, seek  medical attention in person..    I discussed the assessment and treatment plan with the patient. The patient was provided an opportunity to ask questions and all were answered. The patient agreed with the plan and demonstrated an understanding of the instructions.   The patient was advised to call back or seek an in-person evaluation if the symptoms worsen or if the condition fails to improve as anticipated.

## 2020-06-13 NOTE — Assessment & Plan Note (Signed)
Thrush?  Vaginal yeast infection? I advised patient of the limitation of this visit as I cannot examine her.   Although she could have thrush and a vaginal yeast infection, although etiology are possible: Stomatitis, cellulitis near the vaginal area. For that reason we agreed that if she is not responding quickly to treatment she will need to seek medical attention in person. She uses Trelegy for asthma COPD, I encouraged her to rinse after every use and she says she does. Plan: Diflucan 150 mg for 2 days Nystatin 4 times a day for 5 days If she is not improving, seek medical attention in person.Marland Kitchen

## 2020-07-18 ENCOUNTER — Other Ambulatory Visit: Payer: Self-pay | Admitting: Internal Medicine

## 2020-07-21 ENCOUNTER — Other Ambulatory Visit: Payer: Self-pay | Admitting: Internal Medicine

## 2020-07-21 ENCOUNTER — Telehealth: Payer: Self-pay | Admitting: Pulmonary Disease

## 2020-07-21 NOTE — Telephone Encounter (Signed)
Called and spoke with patient who states that Trelegy inhaler is $500. And that Breztri gave her thrush. Patient states that since it is the new year she has a $600 deductible. Patient advised that she was getting Breztri through their patient assistance program before she was switched to Trelegy. Advised patient that I was not sure how that worked since she had a deductible. Will route message to pharmacy to get recommendations and also to Dr. Loanne Drilling.

## 2020-07-22 NOTE — Telephone Encounter (Signed)
Patient's deductible will apply to all medications until it has been met. Patient can try applying for patient assistance, but [employer] requires the household to met the programs out of pocket expenses for this calendar year. I believe it is still $600 OOP expenses on prescriptions.

## 2020-07-22 NOTE — Telephone Encounter (Signed)
Given the response from Pharmacy will forward to Dr. Loanne Drilling to get her recommendations in regards to inhalers for patient.

## 2020-07-24 NOTE — Telephone Encounter (Signed)
I called and spoke with patient. Due to her deductible, Trelegy will cost ~$550. After deductible is met, patient reported prices around ~$150/month.  I checked prices with her local pharmacy and found the combination of Advair and Incruse would total to ~$84 per month. These inhalers have similar products to the Trelegy and should be equivalent in effectiveness. Patient wished to discuss options with her daughter and will return call to the office if she decides to switch to two inhaler option.  If patient calls back, please prescribe the following:  START Advair Diskus 100-50 mcg ONE puff TWICE a day START Incruse ONE puff ONCE a day. STOP Trelegy   Jane Ellison, M.D. [facility] 07/24/2020 9:45 AM    

## 2020-07-29 NOTE — Telephone Encounter (Signed)
Patient states will be staying on Trelegy. Would like to be put on patient assistance program for Trelegy. Patient phone number is [phone]. Will not be available between 2:00 to 4:00. May leave detailed message on voicemail.

## 2020-07-29 NOTE — Telephone Encounter (Signed)
Lmtcb for pt.  

## 2020-07-29 NOTE — Telephone Encounter (Signed)
LMTCB

## 2020-08-01 NOTE — Telephone Encounter (Signed)
LMTCB and will close per protocol  

## 2020-08-14 ENCOUNTER — Other Ambulatory Visit: Payer: Self-pay | Admitting: Internal Medicine

## 2020-08-15 ENCOUNTER — Telehealth: Payer: Self-pay | Admitting: Internal Medicine

## 2020-08-15 NOTE — Telephone Encounter (Signed)
Caller: Merchandiser, retail ([facility]) or Caryl Pina Call back [phone]  Verbal order  They are able to go to patient home and draw blood and take it to [facility]. They will need an order.

## 2020-08-15 NOTE — Telephone Encounter (Signed)
Spoke w/ [facility]- informed that we didn't place order/referral for this. Was informed that the Pt called them and was wanting blood work prior to her visit next week. I informed that our office doesn't order blood work from home- if she is interested to blood work prior to visit we will do that here in our office. Informed that they will inform the Pt.

## 2020-08-20 ENCOUNTER — Encounter: Payer: Self-pay | Admitting: Internal Medicine

## 2020-08-20 ENCOUNTER — Other Ambulatory Visit: Payer: Self-pay

## 2020-08-20 ENCOUNTER — Ambulatory Visit (INDEPENDENT_AMBULATORY_CARE_PROVIDER_SITE_OTHER): Payer: Medicare Other | Admitting: Internal Medicine

## 2020-08-20 VITALS — BP 127/79 | HR 104 | Temp 98.1°F | Resp 16 | Ht 64.0 in

## 2020-08-20 DIAGNOSIS — F32A Depression, unspecified: Secondary | ICD-10-CM

## 2020-08-20 DIAGNOSIS — F419 Anxiety disorder, unspecified: Secondary | ICD-10-CM | POA: Diagnosis not present

## 2020-08-20 DIAGNOSIS — Z1159 Encounter for screening for other viral diseases: Secondary | ICD-10-CM | POA: Diagnosis not present

## 2020-08-20 DIAGNOSIS — Z23 Encounter for immunization: Secondary | ICD-10-CM

## 2020-08-20 DIAGNOSIS — E118 Type 2 diabetes mellitus with unspecified complications: Secondary | ICD-10-CM

## 2020-08-20 DIAGNOSIS — N390 Urinary tract infection, site not specified: Secondary | ICD-10-CM | POA: Diagnosis not present

## 2020-08-20 DIAGNOSIS — I10 Essential (primary) hypertension: Secondary | ICD-10-CM

## 2020-08-20 DIAGNOSIS — R609 Edema, unspecified: Secondary | ICD-10-CM

## 2020-08-20 DIAGNOSIS — Z79899 Other long term (current) drug therapy: Secondary | ICD-10-CM

## 2020-08-20 DIAGNOSIS — I5032 Chronic diastolic (congestive) heart failure: Secondary | ICD-10-CM

## 2020-08-20 MED ORDER — LEVOFLOXACIN 250 MG PO TABS: 250.0000 mg | ORAL_TABLET | Freq: Every day | ORAL | 0 refills | Status: DC | PRN

## 2020-08-20 MED ORDER — TERBINAFINE HCL 1 % EX CREA
1.0000 | TOPICAL_CREAM | Freq: Two times a day (BID) | CUTANEOUS | 3 refills | Status: DC
Start: 2020-08-20 — End: 2021-03-17

## 2020-08-20 NOTE — Progress Notes (Signed)
Subjective:    Patient ID: Becky Stanley, female    DOB: [birth year], 75 y.o.   MRN: [id number]  DOS:  08/20/2020 Type of visit - description: Follow-up, here with her daughter [name]  Multiple concerns: UTI symptoms for the last week, has not taking any antibiotics lately.  Request Levaquin refill. DM: Not doing well with diet, no ambulatory  CBGs. No ambulatory BPs. Has noted more lower extremity edema in the last 2 to 3 weeks.  Doing well with a low-salt diet. Still vaping.   Review of Systems Denies chest pain. DOE: Seems to be a little worse lately. She does have a very sedentary lifestyle. Cough sometimes, sputum production: At baseline.  Past Medical History:  Diagnosis Date  . Anxiety and depression   . Carpal tunnel syndrome, left   . Chronic UTI, h/o   . COPD (chronic obstructive pulmonary disease) ([place])   . Diabetes mellitus ([place])   . DJD (degenerative joint disease)   . Essential hypertension   . GERD (gastroesophageal reflux disease)   . Hyperlipidemia   . Hypertrophic cardiomyopathy ([place])   . Iron deficiency anemia   . Vitamin D deficiency     Past Surgical History:  Procedure Laterality Date  . CHOLECYSTECTOMY    . ESOPHAGEAL DILATION     multiple procedures per Pt  . TOTAL HIP ARTHROPLASTY Bilateral ~2010 ??    Allergies as of 08/20/2020      Reactions   Aminoglycosides    Ciprofloxacin Nausea And Vomiting   Tolerates levaquin   Erythromycin Nausea And Vomiting   Nitrofurantoin Nausea And Vomiting   Other Nausea And Vomiting   Mussels   Penicillins Hives      Medication List       Accurate as of August 20, 2020 11:59 PM. If you have any questions, ask your nurse or doctor.        STOP taking these medications   fluconazole 150 MG tablet Commonly known as: DIFLUCAN Stopped by: Kathlene November, MD   nystatin 100000 UNIT/ML suspension Commonly known as: MYCOSTATIN Stopped by: Kathlene November, MD     TAKE these medications   albuterol 0.63  MG/3ML nebulizer solution Commonly known as: ACCUNEB Take 3 mLs (0.63 mg total) by nebulization 4 (four) times daily as needed for wheezing or shortness of breath.   albuterol 108 (90 Base) MCG/ACT inhaler Commonly known as: Ventolin HFA Inhale 1-2 puffs into the lungs every 4 (four) hours as needed for wheezing or shortness of breath.   alum & mag hydroxide-simeth 200-200-20 MG/5ML suspension Commonly known as: MAALOX/MYLANTA Take 15 mLs by mouth every 6 (six) hours as needed for indigestion or heartburn.   apixaban 5 MG Tabs tablet Commonly known as: Eliquis Take 1 tablet (5 mg total) by mouth 2 (two) times daily.   clonazePAM 1 MG tablet Commonly known as: KLONOPIN TAKE 1 TABLET (1 MG TOTAL) BY MOUTH 2 (TWO) TIMES DAILY AS NEEDED FOR ANXIETY.   esomeprazole 20 MG capsule Commonly known as: NEXIUM Take 20 mg by mouth 2 (two) times daily before a meal.   glimepiride 4 MG tablet Commonly known as: AMARYL TAKE 1/2 TABLET EVERY MORNING AND 1 TABLET BY MOUTH AT NIGHT   HYDROcodone-acetaminophen 5-325 MG tablet Commonly known as: NORCO/VICODIN Take 1 tablet by mouth every 6 (six) hours as needed for moderate pain.   levofloxacin 250 MG tablet Commonly known as: LEVAQUIN Take 1 tablet (250 mg total) by mouth daily as needed. x3 days each  episode of UTI What changed: additional instructions Changed by: Kathlene November, MD   lovastatin 40 MG tablet Commonly known as: MEVACOR Take 1 tablet (40 mg total) by mouth at bedtime.   metFORMIN 500 MG 24 hr tablet Commonly known as: GLUCOPHAGE-XR Take 2 tablets (1,000 mg total) by mouth 2 (two) times daily.   metoprolol tartrate 100 MG tablet Commonly known as: LOPRESSOR Take 2 tablets (200 mg total) by mouth 2 (two) times daily.   mupirocin ointment 2 % Commonly known as: BACTROBAN Apply 1 application topically 2 (two) times daily as needed.   OneTouch Delica Lancets 24M Misc Check blood sugars once daily   OneTouch Verio test  strip Generic drug: glucose blood Check blood sugar once daily   PROBIOTIC-10 PO Take 1 capsule by mouth daily.   sertraline 100 MG tablet Commonly known as: ZOLOFT TAKE 1.5 TABLETS BY MOUTH EVERY DAY   terbinafine 1 % cream Commonly known as: LAMISIL Apply 1 application topically 2 (two) times daily.   torsemide 20 MG tablet Commonly known as: DEMADEX Take 1 tablet (20 mg total) by mouth daily.   Trelegy Ellipta 100-62.5-25 MCG/INH Aepb Generic drug: Fluticasone-Umeclidin-Vilant Inhale 1 puff into the lungs daily.          Objective:   Physical Exam BP 127/79 (BP Location: Left Arm, Patient Position: Sitting, Cuff Size: Normal)   Pulse (!) 104   Temp 98.1 F (36.7 C) (Oral)   Resp 16   Ht 5\' 4"  (1.626 m)   SpO2 97%  General:   Well developed, NAD, BMI noted. HEENT:  Normocephalic . Face symmetric, atraumatic Lungs:  CTA B Normal respiratory effort, no intercostal retractions, no accessory muscle use. Heart: Irregularly irregular, rate about 100. Lower extremities: Trace pretibial edema bilaterally  Skin: Minimal erythema under the breasts. Neurologic:  alert & oriented X3.  Speech normal, gait not tested, on a scooter Psych--  Cognition and judgment appear intact.  Cooperative with normal attention span and concentration.  Behavior appropriate. No anxious or depressed appearing.      Assessment     ASSESSMENT (new patient 10-2018 referred by her daughter [name]) DM HTN High cholesterol Alcoholic sober since 2500, [place] meetings  Anxiety: On sertraline, clonazepam Pulmonary: Asthma, COPD, smoker/vaping ----admitted 06/2017: Acute hypoxic respiratory failure ----CT of the chest in August 2017 which revealed mild groundglass changes primarily in the upper lobes with no noted masses, nodules, or lesions Iron deficiency anemia CV: -CHF DX 06/2017 -HCOM ( avoid excessive diuresis) -Atrial fibrillation, new DX 07/27/2019 -- LE edema L>R DJD Migraines:  On hydrocodone as needed Recurrent UTIs: At some point saw ID, Rx Levaquin for 3 days as needed.  She is allergic to Cipro but tolerates Levaquin.  As of 10-2018, UTIs are less frequent. Uses a scooter since hip surgery ~ 2010  PLAN: DM: On Metformin, glimepiride, no ambulatory BPs, not doing well with diet, "eating candy".  Check A1c. HTN: BP today is acceptable, no ambulatory BPs, currently on metoprolol, Demadex.  Check BMP and CBC.  See next CHF/A. fib:  increased edema, the patient has not let us weight her thus is hard to assess her volume status. She does have what seems to be increased edema and some shortness of breath.  Recommend to increase Demadex to 2 tablets for the next 3 days.  Check a TSH.  Sedentary lifestyle likely also playing a role.  Heart rate around 100 (same as last cardiology visit), on metoprolol 200 mg BID. Yeast infection:  See last visit, she improved, she is now again developing a rash under the breast and at the groins.  Retry Lamisil. UTI: Recurrent UTIs, developed symptoms a week ago, no fever chills.  Request Levaquin, both the patient and her daughter states that recurrent UTIs have been a major issue for her until she was recommended self start Levaquin for 2 to 3 days as recommended by ID.  I reluctantly agreed to send a refill;  risk of C. difficile and resistant infex d/w them.  We will check a UA urine culture. COPD: Seems at baseline, still vaping, not ready to quit. Preventive care:  Flu shot today Again declined at COVID vaccine booster, risk of severe illness discussed. Advance directives discussed RTC 3 months   Time spent 42 minutes, extensive discussion about pros cons of continue to take Levaquin as needed, also counseling about vaccines.  This visit occurred during the SARS-CoV-2 public health emergency.  Safety protocols were in place, including screening questions prior to the visit, additional usage of staff PPE, and extensive cleaning of exam  room while observing appropriate contact time as indicated for disinfecting solutions.

## 2020-08-20 NOTE — Progress Notes (Unsigned)
Pre visit review using our clinic review tool, if applicable. No additional management support is needed unless otherwise documented below in the visit note. 

## 2020-08-20 NOTE — Patient Instructions (Addendum)
For swelling: Take 2 Demadex a day in the morning for the next 3 days.  Check the  blood pressure weekly,  BP GOAL is between 110/65 and  135/85. If it is consistently higher or lower, let me know  Diabetes: Check your blood sugar  once a day at different times of the day GOALS: Fasting before a meal 70- 130  GO TO THE LAB : Get the blood work     [place], [employer] back for a checkup in 3 months    Advance Directive  Advance directives are legal documents that allow you to make decisions about your health care and medical treatment in case you become unable to communicate for yourself. Advance directives let your wishes be known to family, friends, and health care providers. Discussing and writing advance directives should happen over time rather than all at once. Advance directives can be changed and updated at any time. There are different types of advance directives, such as:  Medical power of attorney.  Living will.  Do not resuscitate (DNR) order or do not attempt resuscitation (DNAR) order. Health care proxy and medical power of attorney A health care proxy is also called a health care agent. This person is appointed to make medical decisions for you when you are unable to make decisions for yourself. Generally, people ask a trusted friend or family member to act as their proxy and represent their preferences. Make sure you have an agreement with your trusted person to act as your proxy. A proxy may have to make a medical decision on your behalf if your wishes are not known. A medical power of attorney, also called a durable power of attorney for health care, is a legal document that names your health care proxy. Depending on the laws in your state, the document may need to be:  Signed.  Notarized.  Dated.  Copied.  Witnessed.  Incorporated into your medical record. You may also want to appoint a trusted person to manage  your money in the event you are unable to do so. This is called a durable power of attorney for finances. It is a separate legal document from the durable power of attorney for health care. You may choose your health care proxy or someone different to act as your agent in money matters. If you do not appoint a proxy, or there is a concern that the proxy is not acting in your best interest, a court may appoint a guardian to act on your behalf. Living will A living will is a set of instructions that state your wishes about medical care when you cannot express them yourself. Health care providers should keep a copy of your living will in your medical record. You may want to give a copy to family members or friends. To alert caregivers in case of an emergency, you can place a card in your wallet to let them know that you have a living will and where they can find it. A living will is used if you become:  Terminally ill.  Disabled.  Unable to communicate or make decisions. The following decisions should be included in your living will:  To use or not to use life support equipment, such as dialysis machines and breathing machines (ventilators).  Whether you want a DNR or DNAR order. This tells health care providers not to use cardiopulmonary resuscitation (CPR) if breathing or heartbeat stops.  To use or not to use  tube feeding.  To be given or not to be given food and fluids.  Whether you want comfort (palliative) care when the goal becomes comfort rather than a cure.  Whether you want to donate your organs and tissues. A living will does not give instructions for distributing your money and property if you should pass away. DNR or DNAR A DNR or DNAR order is a request not to have CPR in the event that your heart stops beating or you stop breathing. If a DNR or DNAR order has not been made and shared, a health care provider will try to help any patient whose heart has stopped or who has stopped  breathing. If you plan to have surgery, talk with your health care provider about how your DNR or DNAR order will be followed if problems occur. What if I do not have an advance directive? Some states assign family decision makers to act on your behalf if you do not have an advance directive. Each state has its own laws about advance directives. You may want to check with your health care provider, attorney, or state representative about the laws in your state. Summary  Advance directives are legal documents that allow you to make decisions about your health care and medical treatment in case you become unable to communicate for yourself.  The process of discussing and writing advance directives should happen over time. You can change and update advance directives at any time.  Advance directives may include a medical power of attorney, a living will, and a DNR or DNAR order. This information is not intended to replace advice given to you by your health care provider. Make sure you discuss any questions you have with your health care provider. Document Revised: 04/01/2020 Document Reviewed: 04/01/2020 Elsevier Patient Education  2021 [employer].

## 2020-08-21 LAB — BASIC METABOLIC PANEL WITH GFR
BUN: 22 mg/dL (ref 6–23)
CO2: 26 meq/L (ref 19–32)
Calcium: 9.2 mg/dL (ref 8.4–10.5)
Chloride: 101 meq/L (ref 96–112)
Creatinine, Ser: 0.79 mg/dL (ref 0.40–1.20)
GFR: 74.19 mL/min
Glucose, Bld: 90 mg/dL (ref 70–99)
Potassium: 4.1 meq/L (ref 3.5–5.1)
Sodium: 138 meq/L (ref 135–145)

## 2020-08-21 LAB — URINALYSIS, ROUTINE W REFLEX MICROSCOPIC
Bilirubin Urine: NEGATIVE
Ketones, ur: NEGATIVE
Nitrite: NEGATIVE
Specific Gravity, Urine: 1.01 (ref 1.000–1.030)
Total Protein, Urine: NEGATIVE
Urine Glucose: NEGATIVE
Urobilinogen, UA: 0.2 (ref 0.0–1.0)
pH: 5.5 (ref 5.0–8.0)

## 2020-08-21 LAB — HEMOGLOBIN A1C: Hgb A1c MFr Bld: 6.9 % — ABNORMAL HIGH (ref 4.6–6.5)

## 2020-08-21 LAB — CBC WITH DIFFERENTIAL/PLATELET
Basophils Absolute: 0.1 10*3/uL (ref 0.0–0.1)
Basophils Relative: 1.5 % (ref 0.0–3.0)
Eosinophils Absolute: 0.2 10*3/uL (ref 0.0–0.7)
Eosinophils Relative: 2.5 % (ref 0.0–5.0)
HCT: 37.8 % (ref 36.0–46.0)
Hemoglobin: 12.2 g/dL (ref 12.0–15.0)
Lymphocytes Relative: 17 % (ref 12.0–46.0)
Lymphs Abs: 1.5 10*3/uL (ref 0.7–4.0)
MCHC: 32.2 g/dL (ref 30.0–36.0)
MCV: 84 fl (ref 78.0–100.0)
Monocytes Absolute: 0.7 10*3/uL (ref 0.1–1.0)
Monocytes Relative: 8.3 % (ref 3.0–12.0)
Neutro Abs: 6.3 10*3/uL (ref 1.4–7.7)
Neutrophils Relative %: 70.7 % (ref 43.0–77.0)
Platelets: 264 10*3/uL (ref 150.0–400.0)
RBC: 4.5 Mil/uL (ref 3.87–5.11)
RDW: 17 % — ABNORMAL HIGH (ref 11.5–15.5)
WBC: 8.9 10*3/uL (ref 4.0–10.5)

## 2020-08-21 LAB — DRUG MONITORING, PANEL 8 WITH CONFIRMATION, URINE
6 Acetylmorphine: NEGATIVE ng/mL (ref <10)
Alcohol Metabolites: NEGATIVE ng/mL
Amphetamines: NEGATIVE ng/mL (ref <500)
Benzodiazepines: NEGATIVE ng/mL (ref <100)
Buprenorphine, Urine: NEGATIVE ng/mL (ref <5)
Cocaine Metabolite: NEGATIVE ng/mL (ref <150)
Creatinine: 24.3 mg/dL
MDMA: NEGATIVE ng/mL (ref <500)
Marijuana Metabolite: NEGATIVE ng/mL (ref <20)
Opiates: NEGATIVE ng/mL (ref <100)
Oxidant: NEGATIVE ug/mL
Oxycodone: NEGATIVE ng/mL (ref <100)
pH: 5.7 (ref 4.5–9.0)

## 2020-08-21 LAB — HEPATITIS C ANTIBODY
Hepatitis C Ab: NONREACTIVE
SIGNAL TO CUT-OFF: 0.01 (ref <1.00)

## 2020-08-21 LAB — TSH: TSH: 2.62 u[IU]/mL (ref 0.35–4.50)

## 2020-08-21 LAB — URINE CULTURE
MICRO NUMBER:: 11514141
SPECIMEN QUALITY:: ADEQUATE

## 2020-08-21 LAB — DM TEMPLATE

## 2020-08-21 NOTE — Assessment & Plan Note (Addendum)
DM: On Metformin, glimepiride, no ambulatory BPs, not doing well with diet, "eating candy".  Check A1c. HTN: BP today is acceptable, no ambulatory BPs, currently on metoprolol, Demadex.  Check BMP and CBC.  See next CHF/A. fib:  increased edema, the patient has not let us weight her thus is hard to assess her volume status. She does have what seems to be increased edema and some shortness of breath.  Recommend to increase Demadex to 2 tablets for the next 3 days.  Check a TSH.  Sedentary lifestyle likely also playing a role.  Heart rate around 100 (same as last cardiology visit), on metoprolol 200 mg BID. Yeast infection: See last visit, she improved, she is now again developing a rash under the breast and at the groins.  Retry Lamisil. UTI: Recurrent UTIs, developed symptoms a week ago, no fever chills.  Request Levaquin, both the patient and her daughter states that recurrent UTIs have been a major issue for her until she was recommended self start Levaquin for 2 to 3 days as recommended by ID.  I reluctantly agreed to send a refill;  risk of C. difficile and resistant infex d/w them.  We will check a UA urine culture. COPD: Seems at baseline, still vaping, not ready to quit. Preventive care:  Flu shot today Again declined at COVID vaccine booster, risk of severe illness discussed. Advance directives discussed RTC 3 months

## 2020-08-23 ENCOUNTER — Other Ambulatory Visit: Payer: Self-pay | Admitting: Internal Medicine

## 2020-09-17 ENCOUNTER — Telehealth: Payer: Self-pay | Admitting: Internal Medicine

## 2020-09-17 DIAGNOSIS — F419 Anxiety disorder, unspecified: Secondary | ICD-10-CM

## 2020-09-17 NOTE — Telephone Encounter (Signed)
Requesting: clonazepam 1mg  Contract: 01/08/2020 UDS: 08/20/20 Last Visit: 08/20/20 Next Visit: 11/17/20 Last Refill: 03/18/2020 #60 and 2RF  Please Advise

## 2020-09-17 NOTE — Telephone Encounter (Signed)
Prescription sent

## 2020-09-18 ENCOUNTER — Other Ambulatory Visit: Payer: Self-pay | Admitting: Internal Medicine

## 2020-10-23 IMAGING — MG DIGITAL SCREENING BILAT W/ TOMO W/ CAD
8 series · 8 of 24 positions shown · non-contrast
Comparison: None.

CLINICAL DATA: Screening.

EXAM:
DIGITAL SCREENING BILATERAL MAMMOGRAM WITH TOMO AND CAD

[L CC synth-2D]
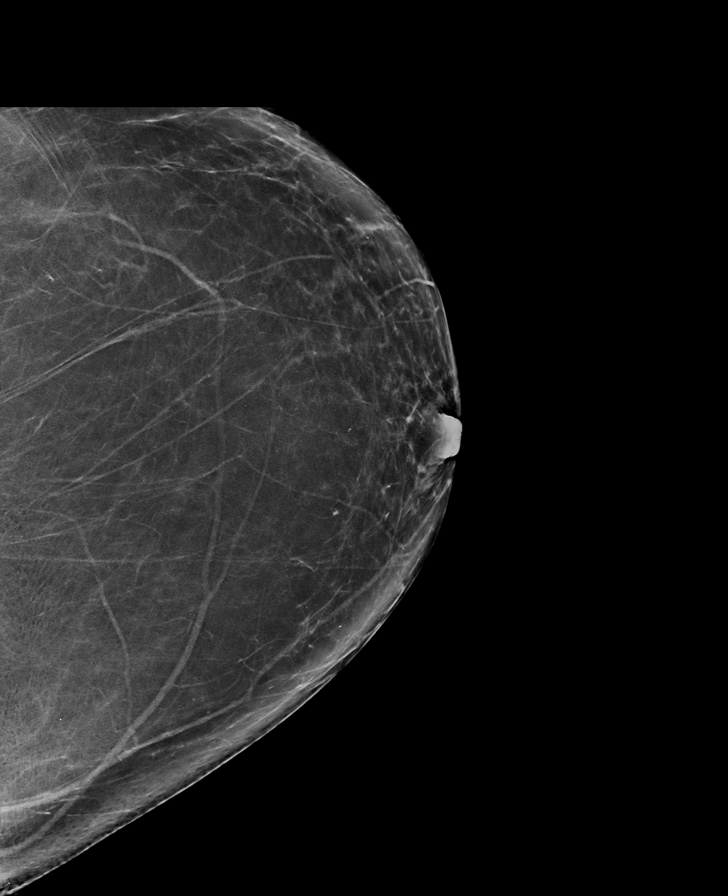

[R CC synth-2D]
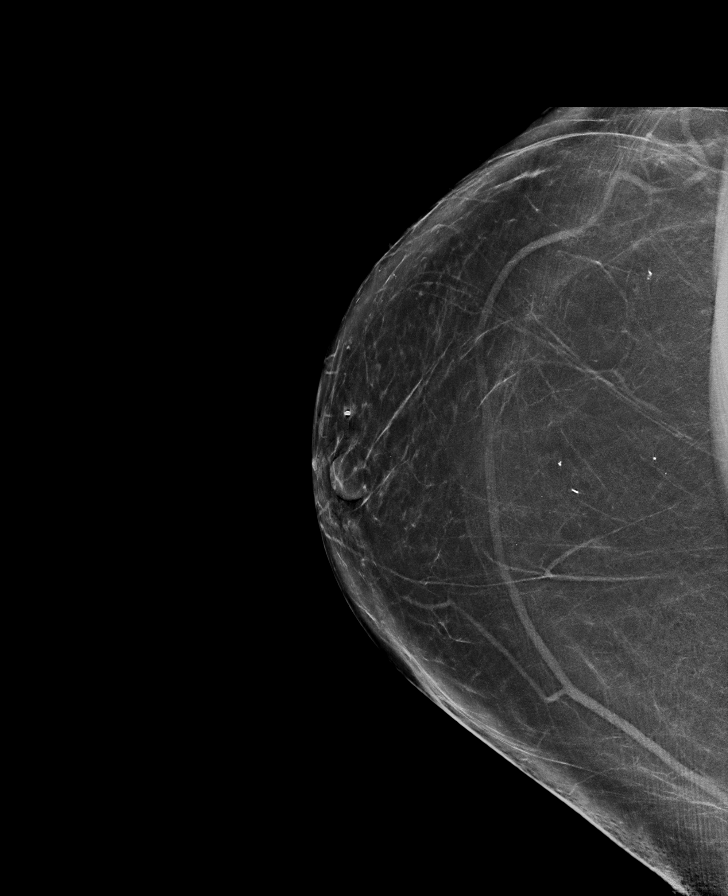

[L MLO synth-2D]
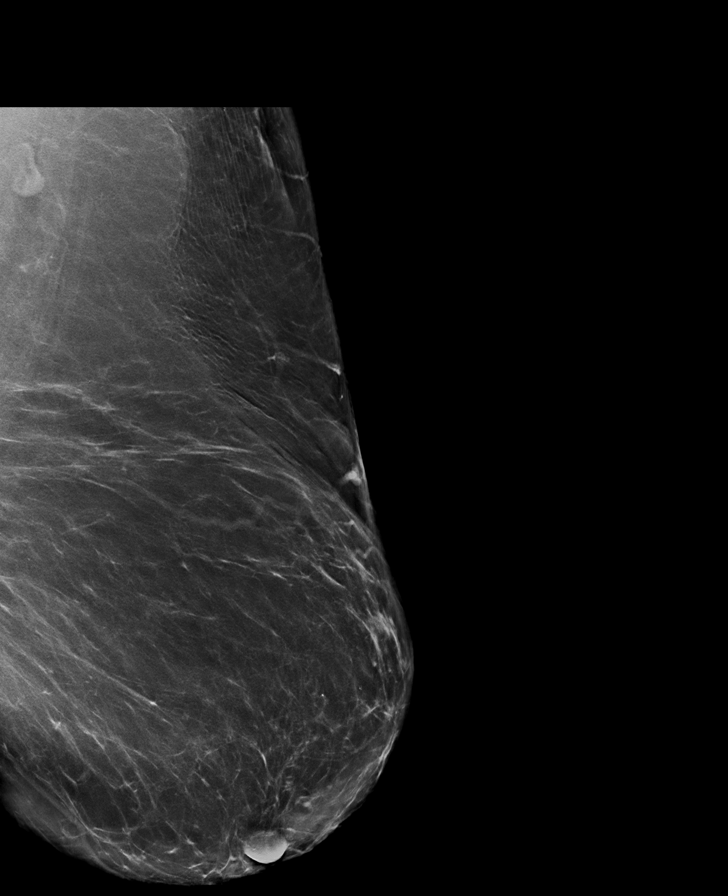

[R MLO synth-2D]
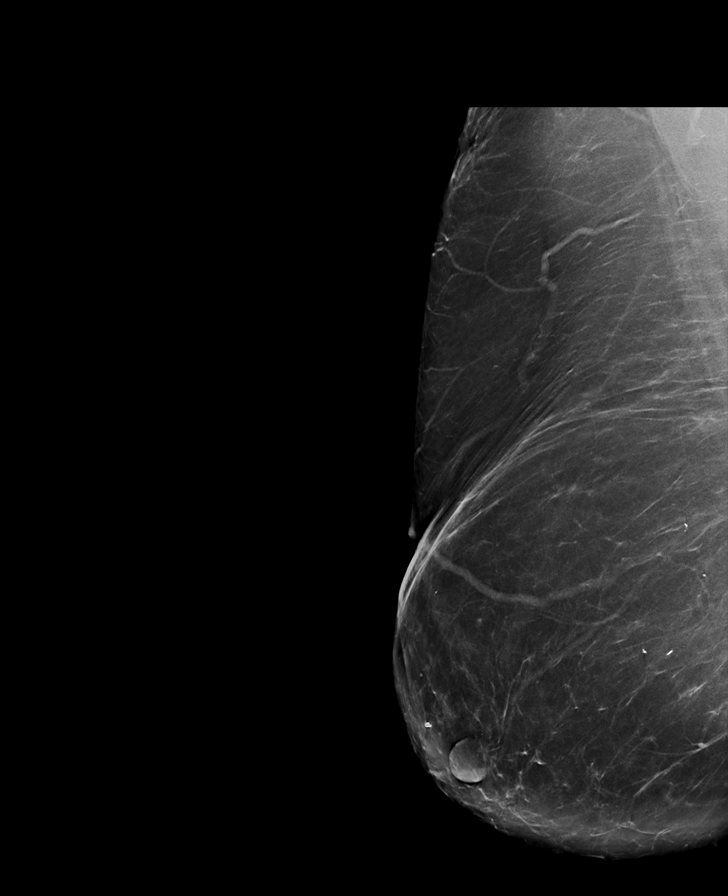

[L CC tomo · tomo slice 38/75.0]
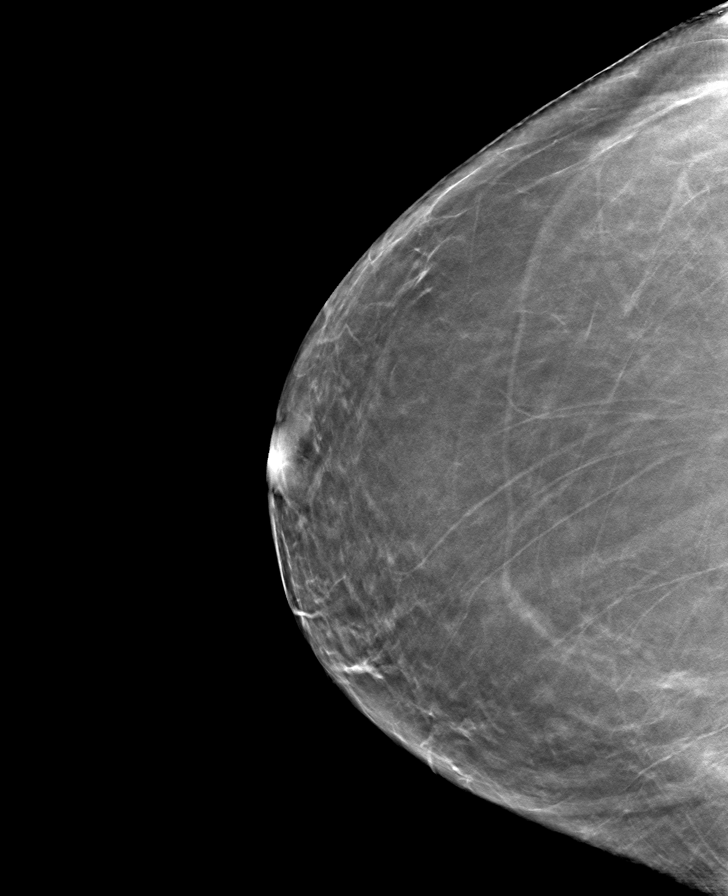

[R MLO tomo · tomo slice 49/97.0]
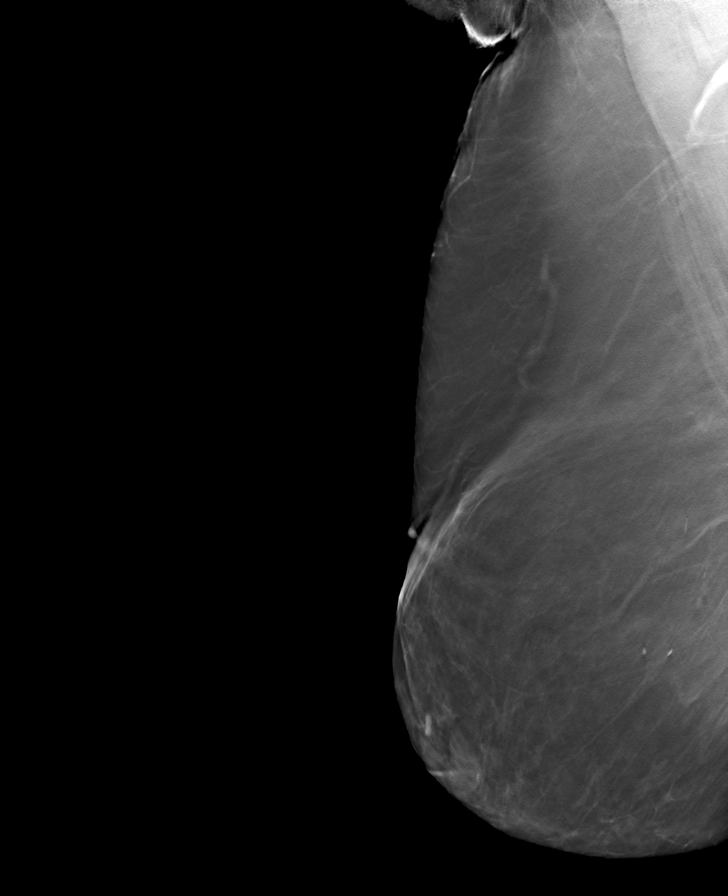

[L MLO tomo · tomo slice 50/99.0]
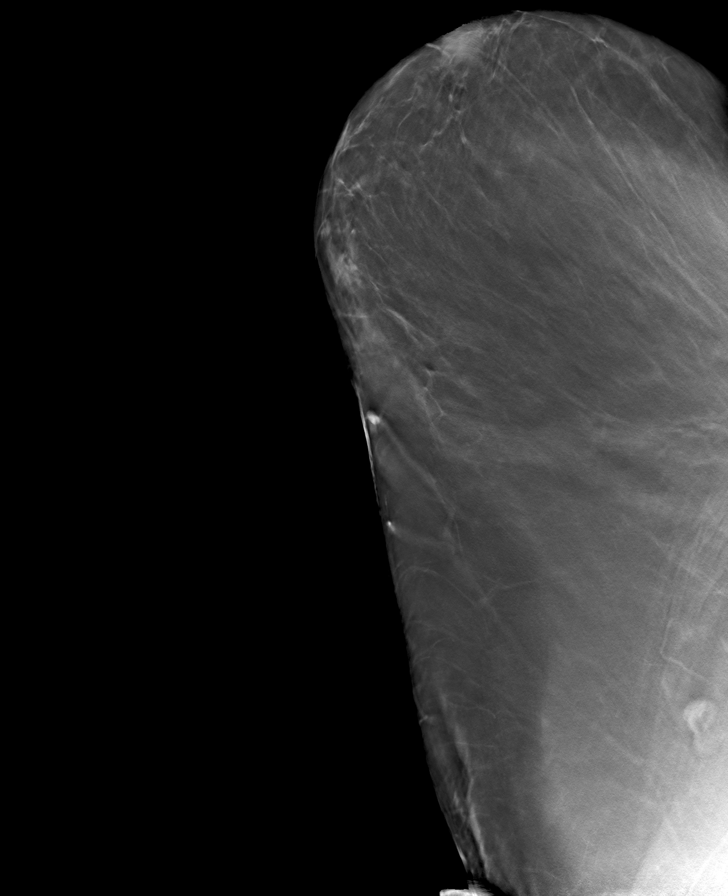

[R CC tomo · tomo slice 37/74.0]
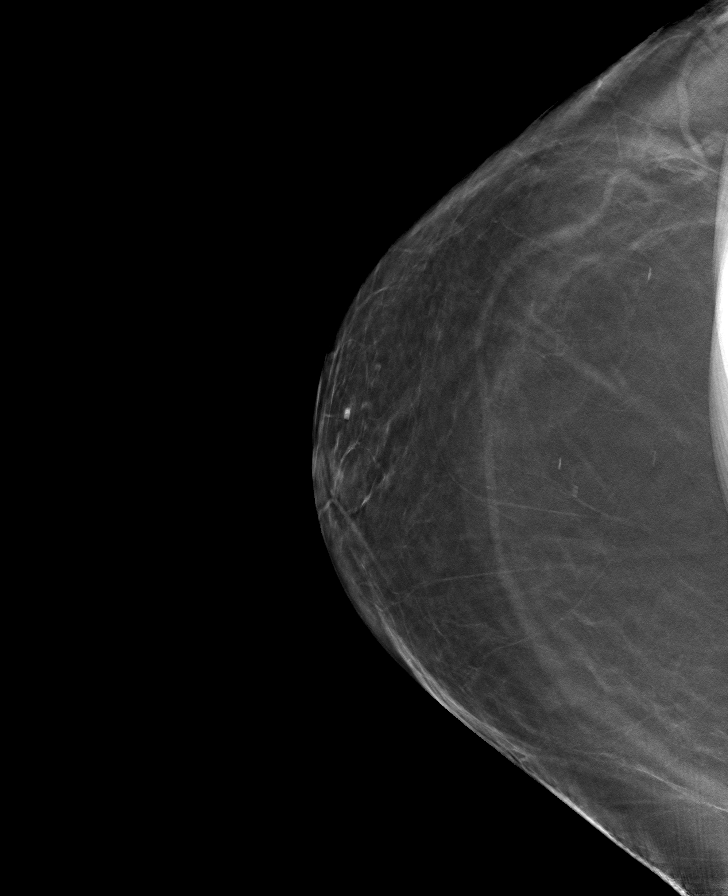

[8 of 24 positions shown; findings below may reference images not displayed]

ACR Breast Density Category b: There are scattered areas of
fibroglandular density.
FINDINGS: There are no findings suspicious for malignancy. Images were
processed with CAD.
IMPRESSION: No mammographic evidence of malignancy. A result letter of this
screening mammogram will be mailed directly to the patient.

RECOMMENDATION:
Screening mammogram in one year. (Code:[id number])

BI-RADS CATEGORY  1: Negative.

## 2020-10-25 ENCOUNTER — Other Ambulatory Visit: Payer: Self-pay | Admitting: Cardiovascular Disease

## 2020-10-27 ENCOUNTER — Other Ambulatory Visit: Payer: Self-pay | Admitting: Cardiovascular Disease

## 2020-10-27 NOTE — Telephone Encounter (Signed)
Rx has been sent to the pharmacy electronically. ° °

## 2020-10-28 ENCOUNTER — Other Ambulatory Visit: Payer: Self-pay

## 2020-10-28 ENCOUNTER — Other Ambulatory Visit: Payer: Self-pay | Admitting: Pulmonary Disease

## 2020-10-28 ENCOUNTER — Ambulatory Visit (INDEPENDENT_AMBULATORY_CARE_PROVIDER_SITE_OTHER): Payer: Medicare Other | Admitting: Pulmonary Disease

## 2020-10-28 ENCOUNTER — Encounter: Payer: Self-pay | Admitting: Pulmonary Disease

## 2020-10-28 ENCOUNTER — Ambulatory Visit (INDEPENDENT_AMBULATORY_CARE_PROVIDER_SITE_OTHER): Payer: Medicare Other

## 2020-10-28 VITALS — BP 116/74 | HR 93 | Temp 97.5°F | Ht 63.0 in

## 2020-10-28 DIAGNOSIS — J449 Chronic obstructive pulmonary disease, unspecified: Secondary | ICD-10-CM

## 2020-10-28 DIAGNOSIS — I5033 Acute on chronic diastolic (congestive) heart failure: Secondary | ICD-10-CM | POA: Diagnosis not present

## 2020-10-28 LAB — BASIC METABOLIC PANEL
BUN: 21 mg/dL (ref 6–23)
CO2: 27 mEq/L (ref 19–32)
Calcium: 9.4 mg/dL (ref 8.4–10.5)
Chloride: 99 mEq/L (ref 96–112)
Creatinine, Ser: 0.82 mg/dL (ref 0.40–1.20)
GFR: 70.85 mL/min (ref >60.00)
Glucose, Bld: 78 mg/dL (ref 70–99)
Potassium: 4.2 mEq/L (ref 3.5–5.1)
Sodium: 135 mEq/L (ref 135–145)

## 2020-10-28 MED ORDER — ALBUTEROL SULFATE 0.63 MG/3ML IN NEBU: 1.0000 | INHALATION_SOLUTION | Freq: Four times a day (QID) | RESPIRATORY_TRACT | 2 refills | Status: AC | PRN

## 2020-10-28 MED ORDER — TRELEGY ELLIPTA 100-62.5-25 MCG/INH IN AEPB: 1.0000 | INHALATION_SPRAY | Freq: Every day | RESPIRATORY_TRACT | 5 refills | Status: DC

## 2020-10-28 NOTE — Progress Notes (Signed)
Subjective:   PATIENT ID: Becky Stanley GENDER: female DOB: Oct 08, 1945, MRN: [id number]   HPI  Chief Complaint  Patient presents with  . Follow-up    SOB at rest and exertion worse since last visit. Albuterol nebs & trelegy refills    Reason for Visit: Follow-up COPD  Becky Stanley is a 75 year old female with HCM, chronic diastolic heart failure, atrial fibrillation who presents for follow-up. Daughter present and provided additional history with patient.  Synopsis: She was previously followed by Pulmonology in [place]. She was diagnosed with COPD three years ago when she was hospitalized. She is currently on Dulera 200-5 mcg TWO puffs TWICE a day. She notices that medication runs out. She has shortness of breath, wheezing and mixed cough worsened with talking and movement. She has chronic chest congestion. Reports at least a 30 pack-year history and quit 20 years ago however started vaping 5 years ago due to stressors. She is interested in quitting but acknowledges that she is having difficulty with motivation. Not able to take wellbutrin (caused reflux) and not interested in chantix (heard about hallucinations/agitation)   Interval  Since our last visit, she continues to feel short of breath. Compliant with Trelegy and feels it is helping. Denies wheezing or cough whenever she consistently uses her inhaler. Rarely needs rescue inhaler. Denies COPD exacerbations in the last year. She continues to vape. She has lower extremity swelling that is difficult to control. She plans to see Dr. Audie Box as soon as she is able for evaluation.  I have personally reviewed patient's past medical/family/social history/allergies/current medications.  Past Medical History:  Diagnosis Date  . Anxiety and depression   . Carpal tunnel syndrome, left   . Chronic UTI, h/o   . COPD (chronic obstructive pulmonary disease) ([place])   . Diabetes mellitus ([place])   . DJD (degenerative joint disease)   .  Essential hypertension   . GERD (gastroesophageal reflux disease)   . Hyperlipidemia   . Hypertrophic cardiomyopathy ([place])   . Iron deficiency anemia   . Vitamin D deficiency      Outpatient Medications Prior to Visit  Medication Sig Dispense Refill  . albuterol (ACCUNEB) 0.63 MG/3ML nebulizer solution Take 3 mLs (0.63 mg total) by nebulization 4 (four) times daily as needed for wheezing or shortness of breath. 75 mL 2  . albuterol (VENTOLIN HFA) 108 (90 Base) MCG/ACT inhaler Inhale 1-2 puffs into the lungs every 4 (four) hours as needed for wheezing or shortness of breath. 1 each 3  . alum & mag hydroxide-simeth (MAALOX/MYLANTA) 200-200-20 MG/5ML suspension Take 15 mLs by mouth every 6 (six) hours as needed for indigestion or heartburn.    Marland Kitchen apixaban (ELIQUIS) 5 MG TABS tablet Take 1 tablet (5 mg total) by mouth 2 (two) times daily. 60 tablet 11  . clonazePAM (KLONOPIN) 1 MG tablet TAKE 1 TABLET (1 MG TOTAL) BY MOUTH 2 (TWO) TIMES DAILY AS NEEDED FOR ANXIETY. 60 tablet 1  . esomeprazole (NEXIUM) 20 MG capsule Take 20 mg by mouth 2 (two) times daily before a meal.    . Fluticasone-Umeclidin-Vilant (TRELEGY ELLIPTA) 100-62.5-25 MCG/INH AEPB Inhale 1 puff into the lungs daily. 60 each 5  . glimepiride (AMARYL) 4 MG tablet Take 0.5 tablets (2 mg total) by mouth daily with breakfast AND 1 tablet (4 mg total) every evening. 135 tablet 1  . glucose blood (ONETOUCH VERIO) test strip USE TO CHECK BLOOD SUGAR ONCE DAILY 100 strip 12  . HYDROcodone-acetaminophen (NORCO/VICODIN) 5-325  MG tablet Take 1 tablet by mouth every 6 (six) hours as needed for moderate pain.    Marland Kitchen levofloxacin (LEVAQUIN) 250 MG tablet Take 1 tablet (250 mg total) by mouth daily as needed. x3 days each episode of UTI 30 tablet 0  . lovastatin (MEVACOR) 40 MG tablet Take 1 tablet (40 mg total) by mouth at bedtime. 90 tablet 1  . metFORMIN (GLUCOPHAGE-XR) 500 MG 24 hr tablet Take 2 tablets (1,000 mg total) by mouth 2 (two) times daily.  360 tablet 1  . metoprolol tartrate (LOPRESSOR) 100 MG tablet TAKE 2 TABLETS (200 MG TOTAL) BY MOUTH 2 (TWO) TIMES DAILY. 360 tablet 1  . mupirocin ointment (BACTROBAN) 2 % Apply 1 application topically 2 (two) times daily as needed. 22 g 0  . OneTouch Delica Lancets 83M MISC Check blood sugars once daily 100 each 12  . Probiotic Product (PROBIOTIC-10 PO) Take 1 capsule by mouth daily.    . sertraline (ZOLOFT) 100 MG tablet Take 1.5 tablets (150 mg total) by mouth daily. 135 tablet 1  . terbinafine (LAMISIL) 1 % cream Apply 1 application topically 2 (two) times daily. 60 g 3  . torsemide (DEMADEX) 20 MG tablet Take 1 tablet (20 mg total) by mouth daily. 180 tablet 3   No facility-administered medications prior to visit.    Review of Systems  Constitutional: Negative for chills, diaphoresis, fever, malaise/fatigue and weight loss.  HENT: Negative for congestion.   Respiratory: Positive for shortness of breath. Negative for cough, hemoptysis, sputum production and wheezing.   Cardiovascular: Negative for chest pain, palpitations and leg swelling.   Objective:   Vitals:   10/28/20 1414  BP: 116/74  Pulse: 93  Temp: (!) 97.5 F (36.4 C)  SpO2: 95%  Height: 5\' 3"  (1.6 m)   SpO2: 95 % O2 Device: None (Room air)  Physical Exam: General: Well-appearing, no acute distress HENT: [place], AT Eyes: EOMI, no scleral icterus Respiratory: Clear to auscultation bilaterally.  No crackles, wheezing or rales Cardiovascular: RRR, -M/R/G, no JVD GI: BS+, soft, nontender Extremities: 2+ pitting edema in lower extremities, tenderness to deep touch Neuro: AAO x4, CNII-XII grossly intact Skin: Intact, no rashes or bruising Psych: Normal mood, normal affect  Data Reviewed:  Imaging: CXR 06/15/2019 - No infiltrate, effusion or edema. Minimal lingula atelectasis CXR 10/28/2020 - Chronic interstitial thickening, lingula atelectasis. No infiltrate, effusion or edema  PFT: None on file  Imaging, labs  and test noted above have been reviewed independently by me.    Assessment & Plan:   Discussion: 75 year old female active smoker with COPD of unknown severity with chronic bronchitis, HCM, chronic diastolic heart failure and atrial fibrillation who presents for follow-up. Compliant with therapy with improvement on triple therapy however remains short of breath. Suspect she is in heart failure exacerbation rather than COPD. Diuresis recommended as noted below.  COPD - uncontrolled but improved --CONTINUE Trelegy 100-62.5-25 ONE puff ONCE a day.  --CONTINUE Albuterol as needed shortness of breath or wheezing. --Recommended Pulmonary Rehab. Declined. Will re-address at next visit  Acute on chronic heart failure exacerbation --Increase torsemide to 40 mg (two tablets) for three days --Follow-up with Cardiology. Patient plans to schedule appointment this week --ADDENDUM: BMET reviewed and electrolytes appropriate for ongoing diuresis  Tobacco abuse Patient is an active smoker (vaping) We discussed smoking cessation for <3 minutes. We discussed triggers and stressors and ways to deal with them. We discussed barriers to continued smoking and benefits of smoking cessation. Patient  not ready to quit   Health Maintenance Immunization History  Administered Date(s) Administered  . Fluad Quad(high Dose 65+) 08/20/2020  . Influenza, High Dose Seasonal PF 06/20/2019  . Influenza-Unspecified 05/16/2006, 04/17/2009, 06/01/2010  . PFIZER(Purple Top)SARS-COV-2 Vaccination 09/09/2019, 10/09/2019  . Pneumococcal-Unspecified 03/30/2007  . Tdap 08/22/2018  Influenza vaccine due. Will pursue at pharmacy.   CT Lung Screen - will discuss at next visit. Declined this time  Orders Placed This Encounter  Procedures  . DG Chest 2 View    Standing Status:   Future    Number of Occurrences:   1    Standing Expiration Date:   10/28/2021    Order Specific Question:   Reason for Exam (SYMPTOM  OR DIAGNOSIS  REQUIRED)    Answer:   COPD    Order Specific Question:   Preferred imaging location?    Answer:   Internal  . Basic Metabolic Panel (BMET)    Standing Status:   Future    Number of Occurrences:   1    Standing Expiration Date:   10/28/2021   Meds ordered this encounter  Medications  . albuterol (ACCUNEB) 0.63 MG/3ML nebulizer solution    Sig: Take 3 mLs (0.63 mg total) by nebulization 4 (four) times daily as needed for wheezing or shortness of breath.    Dispense:  75 mL    Refill:  2  . Fluticasone-Umeclidin-Vilant (TRELEGY ELLIPTA) 100-62.5-25 MCG/INH AEPB    Sig: Inhale 1 puff into the lungs daily.    Dispense:  60 each    Refill:  5    Return in about 3 months (around 01/27/2021).   I have spent a total time of 31-minutes on the day of the appointment reviewing prior documentation, coordinating care and discussing medical diagnosis and plan with the patient/family. Imaging, labs and tests included in this note have been reviewed and interpreted independently by me.  Morse, MD [facility] 10/28/2020 2:47 PM  Office Number [phone]

## 2020-10-28 NOTE — Patient Instructions (Addendum)
Acute on chronic heart failure exacerbation --Increase torsemide to 40 mg (two tablets) for three days --Follow-up with Cardiology. Patient plans to schedule appointment this week  COPD  --CONTINUE Trelegy 100-62.5-25 ONE puff ONCE a day.  --CONTINUE Albuterol as needed shortness of breath or wheezing. --Recommended Pulmonary Rehab. Declined. Will re-address at next visit

## 2020-11-03 ENCOUNTER — Telehealth: Payer: Self-pay | Admitting: Cardiovascular Disease

## 2020-11-03 NOTE — Telephone Encounter (Signed)
Spoke with pt. She state she was experiencing bilateral leg edema and was instructed by pcp to increase torsemide to 40 mg x 3 days. Pt state swelling is much better, but not completely back to normal. Pt also report she recently had a chest xray done by pulmonologist and was instructed to contact Dr. Marisue Ivan to make an appointment due to it showing fluid somewhere, but can't remember the exact locations. Pt also report some SOB with exertions.   Offered an appointment with APP but pt refused. Appointment scheduled for 5/25 with Dr. Marisue Ivan but will forward for additional recommendations.

## 2020-11-03 NOTE — Telephone Encounter (Signed)
Alisha:  You can overbook her for me one day this week. On Thursday or Friday morning.   Thanks!  [place] T. Audie Box, MD, [place]  [address], [place], [place] [address] [phone]  1:56 PM

## 2020-11-03 NOTE — Telephone Encounter (Signed)
New Message:     Pt said Dr Loanne Drilling, her pulmonary doctor told her to see Dr Audie Box. She said Dr Hanley Hays said she had fluid, but did not say where it was. I offered an appt to pt for tomorrow(11-04-20). She said she can only come in the afternoon. The next available  appt with Dr Gordy Councilman is 02-25-21, she can only come in the afternoon for her appointments, Please advse where to move pt to a sooner appt.

## 2020-11-04 NOTE — Telephone Encounter (Signed)
Thanks

## 2020-11-04 NOTE — Telephone Encounter (Signed)
Called pt to informed of Dr. Debbe Mounts recommendations. Pt state she can't attend early morning appointments and requesting to either keep current appointment on 5/25 or another date with a later time.   Will forward to make MD aware.

## 2020-11-05 NOTE — Progress Notes (Signed)
Cardiology Office Note:   Date:  11/06/2020  NAME:  Becky Stanley    MRN: [id number] DOB:  Feb 23, 1946   PCP:  Colon Branch, MD  Cardiologist:  Evalina Field, MD  Electrophysiologist:  None   Referring MD: Colon Branch, MD   Chief Complaint  Patient presents with  . Follow-up    History of Present Illness:   Becky Stanley is a 75 y.o. female with a hx of HFpEF, apical variant HCM, HTN, persistent Afib, tobacco abuse who presents for follow-up.  She was recently seen by her pulmonologist.  Had increased swelling.  Torsemide was increased but diuresis effect has reduced.  She now presents with worsening shortness of breath and lower extremity edema.  She has edema up to her knees.  Lungs are clear.  Recent chest x-ray with pulmonary showed no evidence of vascular congestion.  We did discuss that she needs to work on her salt reduction.  She is consuming quite a bit of salt.  She does do well 50% of the time when her daughter cooks.  Apparently there are meals from packaged foods and processed foods as well as some eating out.  This is likely where we are getting into trouble.  Blood pressure well controlled.  No chest pain.  Pulse in the 50s.  She reports that when she exerts herself she can get profoundly short of breath.  She is not active but I suspect she has an element of congestive heart failure on top of deconditioning.  No issues with her Eliquis.  Working on her diabetes.  Most recent A1c 6.9.  Problem List 1. HCM with apical aneurysm (apical variant?) -diagnosed 2018 and no major issues  -no syncope -not wanting ICD so no further SCD evaluation 2. HTN 3.Diastolic heart failure -EF 50-55% -G2DD 4. Obesity 5.Diabetes  -A1c 6.9 6. Atrial fibrillation, persistent -diagnosed 07/30/2019 7. Tobacco abuse -e cigarettes currently  -35+ years smoking 8. COPD  Past Medical History: Past Medical History:  Diagnosis Date  . Anxiety and depression   . Carpal tunnel  syndrome, left   . Chronic UTI, h/o   . COPD (chronic obstructive pulmonary disease) ([place])   . Diabetes mellitus ([place])   . DJD (degenerative joint disease)   . Essential hypertension   . GERD (gastroesophageal reflux disease)   . Hyperlipidemia   . Hypertrophic cardiomyopathy ([place])   . Iron deficiency anemia   . Vitamin D deficiency     Past Surgical History: Past Surgical History:  Procedure Laterality Date  . CHOLECYSTECTOMY    . ESOPHAGEAL DILATION     multiple procedures per Pt  . TOTAL HIP ARTHROPLASTY Bilateral ~2010 ??    Current Medications: Current Meds  Medication Sig  . albuterol (ACCUNEB) 0.63 MG/3ML nebulizer solution Take 3 mLs (0.63 mg total) by nebulization 4 (four) times daily as needed for wheezing or shortness of breath.  Marland Kitchen albuterol (VENTOLIN HFA) 108 (90 Base) MCG/ACT inhaler Inhale 1-2 puffs into the lungs every 4 (four) hours as needed for wheezing or shortness of breath.  Marland Kitchen alum & mag hydroxide-simeth (MAALOX/MYLANTA) 200-200-20 MG/5ML suspension Take 15 mLs by mouth every 6 (six) hours as needed for indigestion or heartburn.  Marland Kitchen apixaban (ELIQUIS) 5 MG TABS tablet Take 1 tablet (5 mg total) by mouth 2 (two) times daily.  . clonazePAM (KLONOPIN) 1 MG tablet TAKE 1 TABLET (1 MG TOTAL) BY MOUTH 2 (TWO) TIMES DAILY AS NEEDED FOR ANXIETY.  Marland Kitchen esomeprazole ([place])  20 MG capsule Take 20 mg by mouth 2 (two) times daily before a meal.  . Fluticasone-Umeclidin-Vilant (TRELEGY ELLIPTA) 100-62.5-25 MCG/INH AEPB Inhale 1 puff into the lungs daily.  Marland Kitchen glimepiride (AMARYL) 4 MG tablet Take 0.5 tablets (2 mg total) by mouth daily with breakfast AND 1 tablet (4 mg total) every evening.  Marland Kitchen glucose blood (ONETOUCH VERIO) test strip USE TO CHECK BLOOD SUGAR ONCE DAILY  . HYDROcodone-acetaminophen (NORCO/VICODIN) 5-325 MG tablet Take 1 tablet by mouth every 6 (six) hours as needed for moderate pain.  Marland Kitchen levofloxacin (LEVAQUIN) 250 MG tablet Take 1 tablet (250 mg total) by mouth  daily as needed. x3 days each episode of UTI  . lovastatin (MEVACOR) 40 MG tablet Take 1 tablet (40 mg total) by mouth at bedtime.  . metFORMIN (GLUCOPHAGE-XR) 500 MG 24 hr tablet Take 2 tablets (1,000 mg total) by mouth 2 (two) times daily.  . metoprolol tartrate (LOPRESSOR) 100 MG tablet TAKE 2 TABLETS (200 MG TOTAL) BY MOUTH 2 (TWO) TIMES DAILY.  . mupirocin ointment (BACTROBAN) 2 % Apply 1 application topically 2 (two) times daily as needed.  Glory Rosebush Delica Lancets 01U MISC Check blood sugars once daily  . potassium chloride SA (KLOR-CON M20) 20 MEQ tablet Take 1 tablet (20 mEq total) by mouth daily.  . Probiotic Product (PROBIOTIC-10 PO) Take 1 capsule by mouth daily.  . sertraline (ZOLOFT) 100 MG tablet Take 1.5 tablets (150 mg total) by mouth daily.  Marland Kitchen terbinafine (LAMISIL) 1 % cream Apply 1 application topically 2 (two) times daily.     Allergies:    Aminoglycosides, Ciprofloxacin, Erythromycin, Nitrofurantoin, Other, and Penicillins   Social History: Social History   Socioeconomic History  . Marital status: Widowed    Spouse name: Dominica Severin  . Number of children: 2  . Years of education: Not on file  . Highest education level: Not on file  Occupational History  . Occupation: retired  Tobacco Use  . Smoking status: Former Smoker    Packs/day: 1.00    Years: 35.00    Pack years: 35.00    Types: E-cigarettes    Start date: 07/12/1958    Quit date: 09/10/1998    Years since quitting: 22.1  . Smokeless tobacco: Never Used  . Tobacco comment: quit 20 years, started back about 5 years ago, vaping  Vaping Use  . Vaping Use: Every day  . Start date: 09/09/2017  . Substances: Nicotine  Substance and Sexual Activity  . Alcohol use: Never    Comment: h/o ETOH abuse   . Drug use: Never  . Sexual activity: Not on file  Other Topics Concern  . Not on file  Social History Narrative   Moved from [place] to the [place] area home 10/12/2018; lives at her daughter.   Lost husband Dominica Severin  06/2019   Former smoker, as of 10-2018 vaping with plans to quit   Social Determinants of Health   Financial Resource Strain: Not on file  Food Insecurity: Not on file  Transportation Needs: Not on file  Physical Activity: Not on file  Stress: Not on file  Social Connections: Not on file     Family History: The patient's family history includes CAD in her paternal grandfather; COPD in her father; Colon cancer (age of onset: 69) in her father; Multiple sclerosis in her mother. There is no history of Breast cancer.  ROS:   All other ROS reviewed and negative. Pertinent positives noted in the HPI.     EKGs/Labs/Other  Studies Reviewed:   The following studies were personally reviewed by me today:  TTE 06/29/2019 1. Left ventricular ejection fraction, by visual estimation, is 50 to  55%. The left ventricle has normal function. There is mildly increased  left ventricular hypertrophy.  2. Elevated left atrial pressure.  3. Left ventricular diastolic parameters are consistent with Grade II  diastolic dysfunction (pseudonormalization).  4. The left ventricle demonstrates regional wall motion abnormalities.  5. Global right ventricle has normal systolic function.The right  ventricular size is normal.  6. Left atrial size was mildly dilated.  7. Right atrial size was normal.  8. Mild mitral annular calcification.  9. The mitral valve is normal in structure. No evidence of mitral valve  regurgitation. No evidence of mitral stenosis.  10. The tricuspid valve is normal in structure. Tricuspid valve  regurgitation is trivial.  11. The aortic valve has an indeterminant number of cusps. Aortic valve  regurgitation is not visualized. No evidence of aortic valve sclerosis or  stenosis.  12. The pulmonic valve was not well visualized. Pulmonic valve  regurgitation is not visualized.  13. The inferior vena cava is normal in size with greater than 50%  respiratory variability,  suggesting right atrial pressure of 3 mmHg.  14. Extremely limited due to poor sound wave transmission; apical akinesis  with turbulence in mid LVOT and apex; overall low normal LV systolic  function; grade 2 diastolic dysfunction; mild LAE; suggest cardiac MRI to  better assess LV function and LV  apex.   Recent Labs: 01/08/2020: ALT 18 08/20/2020: Hemoglobin 12.2; Platelets 264.0; TSH 2.62 10/28/2020: BUN 21; Creatinine, Ser 0.82; Potassium 4.2; Sodium 135   Recent Lipid Panel    Component Value Date/Time   CHOL 138 01/08/2020 1349   TRIG 158.0 (H) 01/08/2020 1349   HDL 35.30 (L) 01/08/2020 1349   CHOLHDL 4 01/08/2020 1349   VLDL 31.6 01/08/2020 1349   LDLCALC 71 01/08/2020 1349    Physical Exam:   VS:  BP 110/60   Pulse (!) 52   Ht 5\' 4"  (1.626 m)   SpO2 95%    Wt Readings from Last 3 Encounters:  No data found for Wt    General: Well nourished, well developed, in no acute distress Head: Atraumatic, normal size  Eyes: PEERLA, EOMI  Neck: Supple, no JVD Endocrine: No thryomegaly Cardiac: Normal S1, S2; irregular rhythm Lungs: Clear to auscultation bilaterally, no wheezing, rhonchi or rales  Abd: Soft, nontender, no hepatomegaly  Ext: 2+ pitting edema up to the knees Musculoskeletal: No deformities, BUE and BLE strength normal and equal Skin: Warm and dry, no rashes   Neuro: Alert and oriented to person, place, time, and situation, CNII-XII grossly intact, no focal deficits  Psych: Normal mood and affect   ASSESSMENT:   Becky Stanley is a 75 y.o. female who presents for the following: 1. Persistent atrial fibrillation ([place])   2. Chronic diastolic heart failure ([place])   3. Hypertrophic cardiomyopathy ([place])   4. Essential hypertension   5. Tobacco abuse     PLAN:   1. Persistent atrial fibrillation (HCC) -Rate controlled on metoprolol succinate 200 mg twice daily.  Seems to be doing well on this.  We will continue this medication. -She is on Eliquis 5 mg twice  daily.  She will continue this due to her stroke risk given her diagnosis of hypertrophic cardiomyopathy.  2. Chronic diastolic heart failure (H[facility]) -Volume up today.  We will increase her torsemide to 40 mg  twice daily for 5 days.  She will take 20 mEq of potassium daily.  She was then reduced her dose down to 40 mg daily.  I suspect she is getting too much salt in her diet.  We discussed salt reduction strategies.  She will start to work on this.  3. Hypertrophic cardiomyopathy ([place]) -Apical variant hypertrophic cardiomyopathy.  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  She is not wanting an ICD.  Given her age and this is reasonable.  She has no syncope.  No sudden death episodes in the family.  I suspect this will be a benign course.  Given that she would not want an ICD at see no need for further sudden death risk ratification.  4. Essential hypertension -Well-controlled.  Continue current medications.  5. Tobacco abuse -Smoking cessation advised.  Disposition: Return in about 3 months (around 02/05/2021).  Medication Adjustments/Labs and Tests Ordered: Current medicines are reviewed at length with the patient today.  Concerns regarding medicines are outlined above.  No orders of the defined types were placed in this encounter.  Meds ordered this encounter  Medications  . torsemide 40 MG TABS    Sig: Take 40 mg by mouth daily.    Dispense:  180 tablet    Refill:  3  . potassium chloride SA (KLOR-CON M20) 20 MEQ tablet    Sig: Take 1 tablet (20 mEq total) by mouth daily.    Dispense:  90 tablet    Refill:  3    Patient Instructions  Medication Instructions:  Increase Torseimide to 40 mg twice daily for 5 days, then take 40 mg daily  Take Potassium 20 meq once daily   *If you need a refill on your cardiac medications before your next appointment, please call your pharmacy*   Follow-Up: At [facility], you and your health needs are our priority.  As part of our continuing mission to  provide you with exceptional heart care, we have created designated Provider Care Teams.  These Care Teams include your primary Cardiologist (physician) and Advanced Practice Providers (APPs -  Physician Assistants and Nurse Practitioners) who all work together to provide you with the care you need, when you need it.  We recommend signing up for the patient portal called "MyChart".  Sign up information is provided on this After Visit Summary.  MyChart is used to connect with patients for Virtual Visits (Telemedicine).  Patients are able to view lab/test results, encounter notes, upcoming appointments, etc.  Non-urgent messages can be sent to your provider as well.   To learn more about what you can do with MyChart, go to [email or web address].    Your next appointment:   3 month(s)  The format for your next appointment:   In Person  Provider:   Eleonore Chiquito, MD   Other Instructions Please use compression stockings      Time Spent with Patient: I have spent a total of 35 minutes with patient reviewing hospital notes, telemetry, EKGs, labs and examining the patient as well as establishing an assessment and plan that was discussed with the patient.  > 50% of time was spent in direct patient care.  Signed, Addison Naegeli. Audie Box, MD, [place]  [address], [place], [place] [address] [phone]  11/06/2020 10:31 AM

## 2020-11-06 ENCOUNTER — Other Ambulatory Visit: Payer: Self-pay

## 2020-11-06 ENCOUNTER — Encounter: Payer: Self-pay | Admitting: Cardiovascular Disease

## 2020-11-06 ENCOUNTER — Ambulatory Visit (INDEPENDENT_AMBULATORY_CARE_PROVIDER_SITE_OTHER): Payer: Medicare Other | Admitting: Cardiovascular Disease

## 2020-11-06 VITALS — BP 110/60 | HR 52 | Ht 64.0 in

## 2020-11-06 DIAGNOSIS — Z720 Tobacco use: Secondary | ICD-10-CM

## 2020-11-06 DIAGNOSIS — I10 Essential (primary) hypertension: Secondary | ICD-10-CM

## 2020-11-06 DIAGNOSIS — I422 Other hypertrophic cardiomyopathy: Secondary | ICD-10-CM | POA: Diagnosis not present

## 2020-11-06 DIAGNOSIS — I4819 Other persistent atrial fibrillation: Secondary | ICD-10-CM

## 2020-11-06 DIAGNOSIS — I5032 Chronic diastolic (congestive) heart failure: Secondary | ICD-10-CM

## 2020-11-06 MED ORDER — TORSEMIDE 40 MG PO TABS: 40.0000 mg | ORAL_TABLET | Freq: Every day | ORAL | 3 refills | Status: DC

## 2020-11-06 MED ORDER — POTASSIUM CHLORIDE CRYS ER 20 MEQ PO TBCR: 20.0000 meq | EXTENDED_RELEASE_TABLET | Freq: Every day | ORAL | 3 refills | Status: AC

## 2020-11-06 NOTE — Patient Instructions (Signed)
Medication Instructions:  Increase Torseimide to 40 mg twice daily for 5 days, then take 40 mg daily  Take Potassium 20 meq once daily   *If you need a refill on your cardiac medications before your next appointment, please call your pharmacy*   Follow-Up: At [facility], you and your health needs are our priority.  As part of our continuing mission to provide you with exceptional heart care, we have created designated Provider Care Teams.  These Care Teams include your primary Cardiologist (physician) and Advanced Practice Providers (APPs -  Physician Assistants and Nurse Practitioners) who all work together to provide you with the care you need, when you need it.  We recommend signing up for the patient portal called "MyChart".  Sign up information is provided on this After Visit Summary.  MyChart is used to connect with patients for Virtual Visits (Telemedicine).  Patients are able to view lab/test results, encounter notes, upcoming appointments, etc.  Non-urgent messages can be sent to your provider as well.   To learn more about what you can do with MyChart, go to [email or web address].    Your next appointment:   3 month(s)  The format for your next appointment:   In Person  Provider:   Eleonore Chiquito, MD   Other Instructions Please use compression stockings

## 2020-11-17 ENCOUNTER — Ambulatory Visit: Payer: Medicare Other | Admitting: Internal Medicine

## 2020-11-17 ENCOUNTER — Telehealth: Payer: Self-pay | Admitting: Cardiovascular Disease

## 2020-11-17 NOTE — Telephone Encounter (Signed)
Pt c/o medication issue:  1. Name of Medication: torsemide 40 MG TABS and potassium chloride SA (KLOR-CON M20) 20 MEQ tablet  2. How are you currently taking this medication (dosage and times per day)? As directed but was taking 40mg  in AM and 40mg  in PM  3. Are you having a reaction (difficulty breathing--STAT)? no  4. What is your medication issue? Patient states that she was told to call back in to update Dr. Audie Box on her swelling. She followed Dr. Kathalene Frames instructions for 5 days. She states that the swelling has improved tremendously, feet are still a little puffy. Left leg which always swells first since her hip surgery is 90% back to normal and right leg is 100% back to normal. She has not noticed a difference in bruising but swelling has improved. Has also worn compressions socks for several days.

## 2020-11-17 NOTE — Telephone Encounter (Signed)
Called patient, she states that her legs and swelling are better.  I did call to advise patient to continue to monitor swelling, weigh daily and let us know of any weight gain (3 lbs in a day, 5 lbs in a week)   Patient also has a question about taking her potassium tablets, she wants to know if she has to continue this. I did advise that with the fluid pill we recommend to continue this for now, but would route to MD to give recommendations.  Patient aware he is out of office this week, and would respond when he could.   Patient verbalized understanding.  Thankful for call back.

## 2020-11-18 NOTE — Telephone Encounter (Signed)
Called patient, LVM advising of message from MD.  Advised patient to call back.  Left call back number.

## 2020-11-18 NOTE — Telephone Encounter (Signed)
Agree. She takes potassium with the fluid pill. Encourage a low salt diet.   [place] T. Audie Box, MD, [place]  [address] [place], [place] [address] [phone]  7:53 AM

## 2020-11-24 ENCOUNTER — Ambulatory Visit (INDEPENDENT_AMBULATORY_CARE_PROVIDER_SITE_OTHER): Payer: Medicare Other

## 2020-11-24 ENCOUNTER — Ambulatory Visit (INDEPENDENT_AMBULATORY_CARE_PROVIDER_SITE_OTHER): Payer: Medicare Other | Admitting: Family

## 2020-11-24 ENCOUNTER — Encounter: Payer: Self-pay | Admitting: Family

## 2020-11-24 ENCOUNTER — Ambulatory Visit: Payer: Self-pay

## 2020-11-24 DIAGNOSIS — R079 Chest pain, unspecified: Secondary | ICD-10-CM | POA: Diagnosis not present

## 2020-11-24 DIAGNOSIS — M25561 Pain in right knee: Secondary | ICD-10-CM

## 2020-11-24 MED ORDER — HYDROCODONE-ACETAMINOPHEN 5-325 MG PO TABS: 1.0000 | ORAL_TABLET | Freq: Four times a day (QID) | ORAL | 0 refills | Status: DC | PRN

## 2020-11-26 ENCOUNTER — Encounter: Payer: Self-pay | Admitting: Family
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
## 2020-12-02 ENCOUNTER — Ambulatory Visit: Payer: Medicare Other | Admitting: Internal Medicine

## 2020-12-03 ENCOUNTER — Ambulatory Visit: Payer: Medicare Other | Admitting: Cardiovascular Disease

## 2020-12-09 ENCOUNTER — Other Ambulatory Visit: Payer: Self-pay

## 2020-12-09 ENCOUNTER — Encounter: Payer: Self-pay | Admitting: Internal Medicine

## 2020-12-09 ENCOUNTER — Ambulatory Visit (INDEPENDENT_AMBULATORY_CARE_PROVIDER_SITE_OTHER): Payer: Medicare Other | Admitting: Internal Medicine

## 2020-12-09 VITALS — BP 136/82 | HR 89 | Temp 98.3°F | Resp 18 | Ht 64.0 in

## 2020-12-09 DIAGNOSIS — E785 Hyperlipidemia, unspecified: Secondary | ICD-10-CM

## 2020-12-09 DIAGNOSIS — I421 Obstructive hypertrophic cardiomyopathy: Secondary | ICD-10-CM

## 2020-12-09 DIAGNOSIS — E118 Type 2 diabetes mellitus with unspecified complications: Secondary | ICD-10-CM | POA: Diagnosis not present

## 2020-12-09 DIAGNOSIS — W19XXXD Unspecified fall, subsequent encounter: Secondary | ICD-10-CM | POA: Diagnosis not present

## 2020-12-09 DIAGNOSIS — E1169 Type 2 diabetes mellitus with other specified complication: Secondary | ICD-10-CM | POA: Diagnosis not present

## 2020-12-09 NOTE — Progress Notes (Signed)
Subjective:    Patient ID: Becky Stanley, female    DOB: 05-20-1946, 75 y.o.   MRN: [id number]  DOS:  12/09/2020 Type of visit - description: ROV  Here with her daughter.  Today with talk about diabetes, I reviewed notes from pulmonary, cardiology. On 11/19/2020 she had a fall, injured her right knee, went to see orthopedics 5 days later, x-ray was okay. Still very sore, knee swelling just started to decrease a little. Still + knee pain with range of motion, she is able to put weight on it without major problems.  Review of Systems See above   Past Medical History:  Diagnosis Date  . Anxiety and depression   . Carpal tunnel syndrome, left   . Chronic UTI, h/o   . COPD (chronic obstructive pulmonary disease) ([place])   . Diabetes mellitus ([place])   . DJD (degenerative joint disease)   . Essential hypertension   . GERD (gastroesophageal reflux disease)   . Hyperlipidemia   . Hypertrophic cardiomyopathy ([place])   . Iron deficiency anemia   . Vitamin D deficiency     Past Surgical History:  Procedure Laterality Date  . CHOLECYSTECTOMY    . ESOPHAGEAL DILATION     multiple procedures per Pt  . TOTAL HIP ARTHROPLASTY Bilateral ~2010 ??    Allergies as of 12/09/2020      Reactions   Aminoglycosides    Ciprofloxacin Nausea And Vomiting   Tolerates levaquin   Erythromycin Nausea And Vomiting   Nitrofurantoin Nausea And Vomiting   Other Nausea And Vomiting   Mussels   Penicillins Hives      Medication List       Accurate as of Dec 09, 2020  3:32 PM. If you have any questions, ask your nurse or doctor.        albuterol 108 (90 Base) MCG/ACT inhaler Commonly known as: Ventolin HFA Inhale 1-2 puffs into the lungs every 4 (four) hours as needed for wheezing or shortness of breath.   albuterol 0.63 MG/3ML nebulizer solution Commonly known as: ACCUNEB Take 3 mLs (0.63 mg total) by nebulization 4 (four) times daily as needed for wheezing or shortness of breath.   alum &  mag hydroxide-simeth 200-200-20 MG/5ML suspension Commonly known as: MAALOX/MYLANTA Take 15 mLs by mouth every 6 (six) hours as needed for indigestion or heartburn.   apixaban 5 MG Tabs tablet Commonly known as: Eliquis Take 1 tablet (5 mg total) by mouth 2 (two) times daily.   clonazePAM 1 MG tablet Commonly known as: KLONOPIN TAKE 1 TABLET (1 MG TOTAL) BY MOUTH 2 (TWO) TIMES DAILY AS NEEDED FOR ANXIETY.   esomeprazole 20 MG capsule Commonly known as: NEXIUM Take 20 mg by mouth 2 (two) times daily before a meal.   glimepiride 4 MG tablet Commonly known as: AMARYL Take 0.5 tablets (2 mg total) by mouth daily with breakfast AND 1 tablet (4 mg total) every evening.   HYDROcodone-acetaminophen 5-325 MG tablet Commonly known as: NORCO/VICODIN Take 1 tablet by mouth every 6 (six) hours as needed for moderate pain.   levofloxacin 250 MG tablet Commonly known as: LEVAQUIN Take 1 tablet (250 mg total) by mouth daily as needed. x3 days each episode of UTI   lovastatin 40 MG tablet Commonly known as: MEVACOR Take 1 tablet (40 mg total) by mouth at bedtime.   metFORMIN 500 MG 24 hr tablet Commonly known as: GLUCOPHAGE-XR Take 2 tablets (1,000 mg total) by mouth 2 (two) times daily.  metoprolol tartrate 100 MG tablet Commonly known as: LOPRESSOR TAKE 2 TABLETS (200 MG TOTAL) BY MOUTH 2 (TWO) TIMES DAILY.   mupirocin ointment 2 % Commonly known as: BACTROBAN Apply 1 application topically 2 (two) times daily as needed.   OneTouch Delica Lancets 62B Misc Check blood sugars once daily   OneTouch Verio test strip Generic drug: glucose blood USE TO CHECK BLOOD SUGAR ONCE DAILY   potassium chloride SA 20 MEQ tablet Commonly known as: Klor-Con M20 Take 1 tablet (20 mEq total) by mouth daily.   PROBIOTIC-10 PO Take 1 capsule by mouth daily.   sertraline 100 MG tablet Commonly known as: ZOLOFT Take 1.5 tablets (150 mg total) by mouth daily.   terbinafine 1 % cream Commonly  known as: LAMISIL Apply 1 application topically 2 (two) times daily.   Torsemide 40 MG Tabs Take 40 mg by mouth daily.   Trelegy Ellipta 100-62.5-25 MCG/INH Aepb Generic drug: Fluticasone-Umeclidin-Vilant Inhale 1 puff into the lungs daily.          Objective:   Physical Exam BP 136/82 (BP Location: Right Arm, Patient Position: Sitting, Cuff Size: Normal)   Pulse 89   Temp 98.3 F (36.8 C) (Oral)   Resp 18   Ht 5\' 4"  (1.626 m)   SpO2 93%  General:   Well developed, NAD, BMI noted. HEENT:  Normocephalic . Face symmetric, atraumatic Lungs:  CTA B Normal respiratory effort, no intercostal retractions, no accessory muscle use. Heart: Irregularly irregular Lower extremities: Calves Symmetric, no TTP.  + Pitting edema at the pretibial areas bilaterally R knee: + Ecchymosis around it, he has a medial aspect hematoma/joint effusion, + TTP. Skin: Not pale. Not jaundice Neurologic:  alert & oriented X3.  Speech normal, gait: Not tested, uses a scooter. Psych--  Cognition and judgment appear intact.  Cooperative with normal attention span and concentration.  Behavior appropriate. No anxious or depressed appearing.      Assessment     ASSESSMENT (new patient 10-2018 referred by her daughter [name]) DM HTN High cholesterol Alcoholic sober since 7628, [place] meetings  Anxiety: On sertraline, clonazepam Pulmonary: Asthma, COPD, smoker/vaping ----admitted 06/2017: Acute hypoxic respiratory failure ----CT of the chest in August 2017 which revealed mild groundglass changes primarily in the upper lobes with no noted masses, nodules, or lesions Iron deficiency anemia CV: -CHF DX 06/2017 -HCOM ( avoid excessive diuresis) -Atrial fibrillation, new DX 07/27/2019 -- LE edema L>R DJD Migraines: On hydrocodone as needed Recurrent UTIs: At some point saw ID, Rx Levaquin for 3 days as needed.  She is allergic to Cipro but tolerates Levaquin.  As of 10-2018, UTIs are less  frequent. Uses a scooter since hip surgery ~ 2010  PLAN: DM: Since the last office visit reports she is doing better with diet.  No ambulatory CBGs and does not plan to start checking.  She continue with glimepiride, metformin, check A1c. HTN: BP today is very good, last BMP satisfactory. High cholesterol: On lovastatin, not fasting, check a lipid panel. Atrial fibrillation, CHF, HCOM: Saw cardiology 11/06/2020.  They noted her volume to be up at that time, her usual torsemide dose is 40 mg daily, they temporarily asked her to increase torsemide to 40 mg twice a day (along with KCl).  Extensive discussion about low-salt diet which she followed for the most part, the need to weigh herself . For atrial fibrillation she was noted to be anticoagulated and rate controlled. Asthma, COPD, smoker: Saw pulmonology 10/28/2020, meds no change, was rec  pulmonary rehab but she declined it. Vaping: Not ready to quit Fall, injury, R knee: Had a mechanical fall, led to right knee injury, already assessed by Ortho, I believe she has knee effusion, recommend to see Ortho again, she is somewhat hesitant but  agreed to be see them again if not much improved by next week. Fall prevention discussed. Anxiety depression: Good medication compliance, doing well. Preventive care: Declined at a COVID-vaccine booster or PNM 23. RTC 5 to 6 months, declines to return sooner     This visit occurred during the SARS-CoV-2 public health emergency.  Safety protocols were in place, including screening questions prior to the visit, additional usage of staff PPE, and extensive cleaning of exam room while observing appropriate contact time as indicated for disinfecting solutions.

## 2020-12-09 NOTE — Assessment & Plan Note (Signed)
DM: Since the last office visit reports she is doing better with diet.  No ambulatory CBGs and does not plan to start checking.  She continue with glimepiride, metformin, check A1c. HTN: BP today is very good, last BMP satisfactory. High cholesterol: On lovastatin, not fasting, check a lipid panel. Atrial fibrillation, CHF, HCOM: Saw cardiology 11/06/2020.  They noted her volume to be up at that time, her usual torsemide dose is 40 mg daily, they temporarily asked her to increase torsemide to 40 mg twice a day (along with KCl).  Extensive discussion about low-salt diet which she followed for the most part, the need to weigh herself . For atrial fibrillation she was noted to be anticoagulated and rate controlled. Asthma, COPD, smoker: Saw pulmonology 10/28/2020, meds no change, was rec pulmonary rehab but she declined it. Vaping: Not ready to quit Fall, injury, R knee: Had a mechanical fall, led to right knee injury, already assessed by Ortho, I believe she has knee effusion, recommend to see Ortho again, she is somewhat hesitant but  agreed to be see them again if not much improved by next week. Fall prevention discussed. Anxiety depression: Good medication compliance, doing well. Preventive care: Declined at a COVID-vaccine booster or PNM 23. RTC 5 to 6 months, declines to return sooner

## 2020-12-09 NOTE — Patient Instructions (Addendum)
Check the  blood pressure   weekly   BP GOAL is between 110/65 and  135/85. If it is consistently higher or lower, let me know  Please see your orthopedic doctor if you are not improving in the next few days  GO TO THE LAB : Get the blood work     [place], [place] back for a checkup in 5 to 6 months   Fall Prevention in the Home, Adult Falls can cause injuries and can affect people from all age groups. There are many simple things that you can do to make your home safe and to help prevent falls. Ask for help when making these changes, if needed. What actions can I take to prevent falls? General instructions  Use good lighting in all rooms. Replace any light bulbs that burn out.  Turn on lights if it is dark. Use night-lights.  Place frequently used items in easy-to-reach places. Lower the shelves around your home if necessary.  Set up furniture so that there are clear paths around it. Avoid moving your furniture around.  Remove throw rugs and other tripping hazards from the floor.  Avoid walking on wet floors.  Fix any uneven floor surfaces.  Add color or contrast paint or tape to grab bars and handrails in your home. Place contrasting color strips on the first and last steps of stairways.  When you use a stepladder, make sure that it is completely opened and that the sides are firmly locked. Have someone hold the ladder while you are using it. Do not climb a closed stepladder.  Be aware of any and all pets. What can I do in the bathroom?  Keep the floor dry. Immediately clean up any water that spills onto the floor.  Remove soap buildup in the tub or shower on a regular basis.  Use non-skid mats or decals on the floor of the tub or shower.  Attach bath mats securely with double-sided, non-slip rug tape.  If you need to sit down while you are in the shower, use a plastic, non-slip stool.  Install grab bars by the toilet and  in the tub and shower. Do not use towel bars as grab bars.      What can I do in the bedroom?  Make sure that a bedside light is easy to reach.  Do not use oversized bedding that drapes onto the floor.  Have a firm chair that has side arms to use for getting dressed. What can I do in the kitchen?  Clean up any spills right away.  If you need to reach for something above you, use a sturdy step stool that has a grab bar.  Keep electrical cables out of the way.  Do not use floor polish or wax that makes floors slippery. If you must use wax, make sure that it is non-skid floor wax. What can I do in the stairways?  Do not leave any items on the stairs.  Make sure that you have a light switch at the top of the stairs and the bottom of the stairs. Have them installed if you do not have them.  Make sure that there are handrails on both sides of the stairs. Fix handrails that are broken or loose. Make sure that handrails are as long as the stairways.  Install non-slip stair treads on all stairs in your home.  Avoid having throw rugs at the top or bottom of stairways,  or secure the rugs with carpet tape to prevent them from moving.  Choose a carpet design that does not hide the edge of steps on the stairway.  Check any carpeting to make sure that it is firmly attached to the stairs. Fix any carpet that is loose or worn. What can I do on the outside of my home?  Use bright outdoor lighting.  Regularly repair the edges of walkways and driveways and fix any cracks.  Remove high doorway thresholds.  Trim any shrubbery on the main path into your home.  Regularly check that handrails are securely fastened and in good repair. Both sides of any steps should have handrails.  Install guardrails along the edges of any raised decks or porches.  Clear walkways of debris and clutter, including tools and rocks.  Have leaves, snow, and ice cleared regularly.  Use sand or salt on walkways  during winter months.  In the garage, clean up any spills right away, including grease or oil spills. What other actions can I take?  Wear closed-toe shoes that fit well and support your feet. Wear shoes that have rubber soles or low heels.  Use mobility aids as needed, such as canes, walkers, scooters, and crutches.  Review your medicines with your health care provider. Some medicines can cause dizziness or changes in blood pressure, which increase your risk of falling. Talk with your health care provider about other ways that you can decrease your risk of falls. This may include working with a physical therapist or trainer to improve your strength, balance, and endurance. Where to find more information  Centers for Disease Control and Prevention, [facility]AD[facility]: WebmailGuide.co.za  [employer] on Aging: BrainJudge.co.uk Contact a health care provider if:  You are afraid of falling at home.  You feel weak, drowsy, or dizzy at home.  You fall at home. Summary  There are many simple things that you can do to make your home safe and to help prevent falls.  Ways to make your home safe include removing tripping hazards and installing grab bars in the bathroom.  Ask for help when making these changes in your home. This information is not intended to replace advice given to you by your health care provider. Make sure you discuss any questions you have with your health care provider. Document Revised: 06/10/2017 Document Reviewed: 02/10/2017 Elsevier Patient Education  2021 [employer].

## 2020-12-10 LAB — LIPID PANEL
Cholesterol: 111 mg/dL (ref 0–200)
HDL: 33.9 mg/dL — ABNORMAL LOW (ref >39.00)
LDL Cholesterol: 55 mg/dL (ref 0–99)
NonHDL: 76.63
Total CHOL/HDL Ratio: 3
Triglycerides: 107 mg/dL (ref 0.0–149.0)
VLDL: 21.4 mg/dL (ref 0.0–40.0)

## 2020-12-10 LAB — HEMOGLOBIN A1C: Hgb A1c MFr Bld: 6.8 % — ABNORMAL HIGH (ref 4.6–6.5)

## 2020-12-22 ENCOUNTER — Telehealth: Payer: Self-pay

## 2020-12-22 ENCOUNTER — Other Ambulatory Visit: Payer: Self-pay

## 2020-12-22 NOTE — Telephone Encounter (Signed)
PT REQUEST REFILL OF ELIQUIS TO BE SENT TO [facility] BUT HAS NO RECENT WT. WILL ROUTE TO PHARMD POOL FOR ASSESSMENT. 72F, WT (UNKNOWN), SCR 0.82 10/28/20, LOVW/ONEAL 11/06/20

## 2020-12-22 NOTE — Telephone Encounter (Signed)
Received a notification that patient was needing refill for Eliquis 5 mg.  Thanks!

## 2020-12-23 MED ORDER — ELIQUIS 5 MG PO TABS: 5.0000 mg | ORAL_TABLET | Freq: Two times a day (BID) | ORAL | 1 refills | Status: DC

## 2020-12-23 NOTE — Telephone Encounter (Signed)
33F, WT (UNABLE TO FIND), SCR 0.82 10/28/20, LOVW/ONEAL 11/06/20

## 2020-12-23 NOTE — Telephone Encounter (Signed)
PT REQUEST REFILL OF ELIQUIS TO BE SENT [facility] BUT HAS NO RECENT WT. WILL ROUTE TO PHARMD POOL FOR ASSESSMENT. 19F, WT (UNKNOWN), SCR 0.82 10/28/20, LOVW/ONEAL 11/06/20

## 2020-12-23 NOTE — Telephone Encounter (Signed)
*  STAT* If patient is at the pharmacy, call can be transferred to refill team.   1. Which medications need to be refilled? (please list name of each medication and dose if known) Eliquis 5 mg   2. Which pharmacy/location (including street and city if local pharmacy) is medication to be sent to? [phone] [facility]   3. Do they need a 30 day or 90 day supply? [place]

## 2020-12-24 ENCOUNTER — Telehealth: Payer: Self-pay | Admitting: Cardiovascular Disease

## 2020-12-24 MED ORDER — ELIQUIS 5 MG PO TABS: 5.0000 mg | ORAL_TABLET | Freq: Two times a day (BID) | ORAL | 3 refills | Status: DC

## 2020-12-24 NOTE — Telephone Encounter (Signed)
Pt c/o medication issue:  1. Name of Medication:   apixaban (ELIQUIS) 5 MG TABS tablet  2. How are you currently taking this medication (dosage and times per day)?   3. Are you having a reaction (difficulty breathing--STAT)?   4. What is your medication issue?   Patient is following up in hope that she can someone can contact [facility] for assistance. She provided an Rx [phone].

## 2020-12-24 NOTE — Telephone Encounter (Signed)
*  STAT* If patient is at the pharmacy, call can be transferred to refill team.   1. Which medications need to be refilled? (please list name of each medication and dose if known) Eliquis   2. Which pharmacy/location (including street and city if local pharmacy) is medication to be sent to? Mail order   3. Do they need a 30 day or 90 day supply? [place]

## 2020-12-24 NOTE — Addendum Note (Signed)
Addended by: Patria Mane A on: 12/24/2020 02:10 PM   Modules accepted: Orders

## 2020-12-24 NOTE — Telephone Encounter (Signed)
Spoke to patient, patient states she called [facility] Assistance and was told she needed a new prescription from her doctor.      Rx sent to [facility] for Eliquis.   Patient aware and verbalized understanding.

## 2020-12-25 MED ORDER — ELIQUIS 5 MG PO TABS: 5.0000 mg | ORAL_TABLET | Freq: Two times a day (BID) | ORAL | 1 refills | Status: AC

## 2020-12-25 NOTE — Telephone Encounter (Signed)
Last OV 11/06/20 74yo Wt 264 (on 08/14/2018) Scr = 0.82 10/28/2020

## 2021-01-07 ENCOUNTER — Telehealth: Payer: Self-pay

## 2021-01-07 NOTE — Telephone Encounter (Signed)
  Care Management   Follow Up Note   01/07/2021 Name: Becky Stanley MRN: [id number] DOB: Feb 16, 1946   Referred by: Colon Branch, MD Reason for referral : Chronic Care Management (Unsuccessful call)   SW placed an unsuccessful outbound call to the patient in an attempt to follow up on care management needs following recent engagement with [facility]. SW left a HIPAA compliant voice message requesting a return call.   Follow Up Plan: The care management team will reach out to the patient again over the next 14 days.  Should the patient return a call to the practice please have her contact me directly at [phone].  Daneen Schick, BSW, CDP Social Worker, Certified Dementia Practitioner [facility] [phone]

## 2021-01-30 ENCOUNTER — Other Ambulatory Visit: Payer: Self-pay | Admitting: Internal Medicine

## 2021-02-02 ENCOUNTER — Other Ambulatory Visit: Payer: Self-pay | Admitting: Internal Medicine

## 2021-02-02 ENCOUNTER — Telehealth: Payer: Self-pay | Admitting: Internal Medicine

## 2021-02-02 DIAGNOSIS — F419 Anxiety disorder, unspecified: Secondary | ICD-10-CM

## 2021-02-02 NOTE — Telephone Encounter (Signed)
Medication: clonazePAM (KLONOPIN) 1 MG tablet   Has the patient contacted their pharmacy? Yes.   (If no, request that the patient contact the pharmacy for the refill.) (If yes, when and what did the pharmacy advise?)  Preferred Pharmacy (with phone number or street name):  [id number] [place] [id number] Phone:  [phone] Fax:  [phone]  Agent: Please be advised that RX refills may take up to 3 business days. We ask that you follow-up with your pharmacy.

## 2021-02-03 MED ORDER — CLONAZEPAM 1 MG PO TABS: 1.0000 mg | ORAL_TABLET | Freq: Two times a day (BID) | ORAL | 2 refills | Status: DC | PRN

## 2021-02-03 NOTE — Telephone Encounter (Signed)
Requesting: clonazepam '1mg'$  Contract: 01/08/2020 UDS: 08/20/2020 Last Visit: 12/09/2020 Next Visit: 05/04/2021 Last Refill: 09/17/2020 #60 and 1RF  Please Advise

## 2021-02-03 NOTE — Telephone Encounter (Signed)
Pdmp ok, rx sent  ? ?

## 2021-02-06 ENCOUNTER — Telehealth: Payer: Self-pay

## 2021-02-06 NOTE — Telephone Encounter (Signed)
  Care Management   Follow Up Note   02/06/2021 Name: Becky Stanley MRN: [id number] DOB: 1945-12-09   Referred by: Colon Branch, MD Reason for referral : Chronic Care Management   Successful outbound call placed to Anneli Inlow to assist with care coordination needs following recent discharge from [place]. Patient able to verify HIPAA by stating name, DOB, and address. SW discussed the patient has access to work with RN Care Manager Thea Silversmith who is a member of the care management team at her primary providers office. SW assessed for patient interest in engaging with RN Care Manager to address disease management needs. Patient declined service at this time stating she is doing well since ending care with [facility]. Patient reports her daughter and son in law are available to assist her as needed.  Follow Up Plan:  Encouraged the patient to speak with her primary care provider if she would like to work with the care management team in the future.  Daneen Schick, BSW, CDP Social Worker, Certified Dementia Practitioner [facility] [phone]

## 2021-02-08 NOTE — Progress Notes (Signed)
Cardiology Office Note:   Date:  02/09/2021  NAME:  Becky Stanley    MRN: [id number] DOB:  1946-01-03   PCP:  Colon Branch, MD  Cardiologist:  Evalina Field, MD   Referring MD: Colon Branch, MD   Chief Complaint  Patient presents with   Follow-up   History of Present Illness:   Becky Stanley is a 75 y.o. female with a hx of persistent Afib, apical variant HCM, HFpEF, COPD/tobacco abuse who presents for follow-up. Torsemide increased to BID for 5 days at last appointment due to increased volume.  She reports she is doing fairly well.  Still has some lower extremity edema.  She is starting develop venous ulcers.  She reports that her leg swelling was much improved initially but has returned.  She reports it is always worse in the afternoons.  It is worse when she is on her feet.  She was not able to wear her compression stockings today.  She denies any chest pain.  She denies any rapid heartbeat sensation.  She reports that she does get short of breath but she has COPD and continues to smoke.  No bleeding issues on Eliquis.  She did suffer a fall lately.  She apparently has right knee swelling.  Primary care physician did not drain it.  She may need to see orthopedics.  She is watching her salt intake.  She now notices several foods that have high salt content.  She is working on this more diligently.  Left leg is more swollen than the right leg.  This is related to her prior hip surgery.  She is on Eliquis.  No concern for DVT.  Her blood pressure is well controlled.  EKG shows she is in A. fib with heart rate 103 bpm.  Problem List 1. Apical Hypertrophic Cardiomyopathy  -apical aneurysm  -diagnosed 2018 and no major issues  -no syncope -not wanting ICD so no further SCD evaluation 2. HTN 3. Diastolic heart failure  -EF 50-55% -G2DD 4. Obesity 5.  Diabetes  -A1c 6.8 6. Atrial fibrillation, persistent  -diagnosed 07/30/2019 7. Tobacco abuse  -e cigarettes currently -35+ years  smoking  8. COPD  Past Medical History: Past Medical History:  Diagnosis Date   Anxiety and depression    Carpal tunnel syndrome, left    Chronic UTI, h/o    COPD (chronic obstructive pulmonary disease) (HCC)    Diabetes mellitus (HCC)    DJD (degenerative joint disease)    Essential hypertension    GERD (gastroesophageal reflux disease)    Hyperlipidemia    Hypertrophic cardiomyopathy (HCC)    Iron deficiency anemia    Vitamin D deficiency     Past Surgical History: Past Surgical History:  Procedure Laterality Date   CHOLECYSTECTOMY     ESOPHAGEAL DILATION     multiple procedures per Pt   TOTAL HIP ARTHROPLASTY Bilateral ~2010 ??    Current Medications: Current Meds  Medication Sig   albuterol (ACCUNEB) 0.63 MG/3ML nebulizer solution Take 3 mLs (0.63 mg total) by nebulization 4 (four) times daily as needed for wheezing or shortness of breath.   albuterol (VENTOLIN HFA) 108 (90 Base) MCG/ACT inhaler Inhale 1-2 puffs into the lungs every 4 (four) hours as needed for wheezing or shortness of breath.   alum & mag hydroxide-simeth (MAALOX/MYLANTA) 200-200-20 MG/5ML suspension Take 15 mLs by mouth every 6 (six) hours as needed for indigestion or heartburn.   apixaban (ELIQUIS) 5 MG TABS tablet Take  1 tablet (5 mg total) by mouth 2 (two) times daily.   clonazePAM (KLONOPIN) 1 MG tablet Take 1 tablet (1 mg total) by mouth 2 (two) times daily as needed for anxiety.   esomeprazole (NEXIUM) 20 MG capsule Take 20 mg by mouth 2 (two) times daily before a meal.   Fluticasone-Umeclidin-Vilant (TRELEGY ELLIPTA) 100-62.5-25 MCG/INH AEPB Inhale 1 puff into the lungs daily.   glimepiride (AMARYL) 4 MG tablet Take 0.5 tablets (2 mg total) by mouth daily with breakfast AND 1 tablet (4 mg total) every evening.   glucose blood (ONETOUCH VERIO) test strip USE TO CHECK BLOOD SUGAR ONCE DAILY   HYDROcodone-acetaminophen (NORCO/VICODIN) 5-325 MG tablet Take 1 tablet by mouth every 6 (six) hours as  needed for moderate pain.   levofloxacin (LEVAQUIN) 250 MG tablet Take 1 tablet (250 mg total) by mouth daily as needed. x3 days each episode of UTI   lovastatin (MEVACOR) 40 MG tablet Take 1 tablet (40 mg total) by mouth at bedtime.   metFORMIN (GLUCOPHAGE-XR) 500 MG 24 hr tablet Take 2 tablets (1,000 mg total) by mouth 2 (two) times daily.   metoprolol tartrate (LOPRESSOR) 100 MG tablet TAKE 2 TABLETS (200 MG TOTAL) BY MOUTH 2 (TWO) TIMES DAILY.   mupirocin ointment (BACTROBAN) 2 % Apply 1 application topically 2 (two) times daily as needed.   OneTouch Delica Lancets [id number] MISC Check blood sugars once daily   Probiotic Product (PROBIOTIC-10 PO) Take 1 capsule by mouth daily.   sertraline (ZOLOFT) 100 MG tablet Take 1.5 tablets (150 mg total) by mouth daily.   terbinafine (LAMISIL) 1 % cream Apply 1 application topically 2 (two) times daily.     Allergies:    Aminoglycosides, Ciprofloxacin, Erythromycin, Nitrofurantoin, Other, and Penicillins   Social History: Social History   Socioeconomic History   Marital status: Widowed    Spouse name: Dominica Severin   Number of children: 2   Years of education: Not on file   Highest education level: Not on file  Occupational History   Occupation: retired  Tobacco Use   Smoking status: Former    Packs/day: 1.00    Years: 35.00    Pack years: 35.00    Types: E-cigarettes, Cigarettes    Start date: 07/12/1958    Quit date: 09/10/1998    Years since quitting: 22.4   Smokeless tobacco: Never   Tobacco comments:    quit 20 years, started back about 5 years ago, vaping  Vaping Use   Vaping Use: Every day   Start date: 09/09/2017   Substances: Nicotine  Substance and Sexual Activity   Alcohol use: Never    Comment: h/o ETOH abuse    Drug use: Never   Sexual activity: Not on file  Other Topics Concern   Not on file  Social History Narrative   Moved from [place] to the [place] area home 10/12/2018; lives at her daughter.   Lost husband Dominica Severin 06/2019    Former smoker, as of 10-2018 vaping with plans to quit   Social Determinants of Health   Financial Resource Strain: Not on file  Food Insecurity: Not on file  Transportation Needs: Not on file  Physical Activity: Not on file  Stress: Not on file  Social Connections: Not on file     Family History: The patient's family history includes CAD in her paternal grandfather; COPD in her father; Colon cancer (age of onset: 93) in her father; Multiple sclerosis in her mother. There is no history of Breast cancer.  ROS:   All other ROS reviewed and negative. Pertinent positives noted in the HPI.     EKGs/Labs/Other Studies Reviewed:   The following studies were personally reviewed by me today:  EKG:  EKG is ordered today.  The ekg ordered today demonstrates atrial fibrillation heart rate 103, no acute ischemic changes, anterolateral T wave inversions, and was personally reviewed by me.   TTE 06/29/2019  1. Left ventricular ejection fraction, by visual estimation, is 50 to  55%. The left ventricle has normal function. There is mildly increased  left ventricular hypertrophy.   2. Elevated left atrial pressure.   3. Left ventricular diastolic parameters are consistent with Grade II  diastolic dysfunction (pseudonormalization).   4. The left ventricle demonstrates regional wall motion abnormalities.   5. Global right ventricle has normal systolic function.The right  ventricular size is normal.   6. Left atrial size was mildly dilated.   7. Right atrial size was normal.   8. Mild mitral annular calcification.   9. The mitral valve is normal in structure. No evidence of mitral valve  regurgitation. No evidence of mitral stenosis.  10. The tricuspid valve is normal in structure. Tricuspid valve  regurgitation is trivial.  11. The aortic valve has an indeterminant number of cusps. Aortic valve  regurgitation is not visualized. No evidence of aortic valve sclerosis or  stenosis.  12. The  pulmonic valve was not well visualized. Pulmonic valve  regurgitation is not visualized.  13. The inferior vena cava is normal in size with greater than 50%  respiratory variability, suggesting right atrial pressure of 3 mmHg.  14. Extremely limited due to poor sound wave transmission; apical akinesis  with turbulence in mid LVOT and apex; overall low normal LV systolic  function; grade 2 diastolic dysfunction; mild LAE; suggest cardiac MRI to  better assess LV function and LV  apex.   Recent Labs: 08/20/2020: Hemoglobin 12.2; Platelets 264.0; TSH 2.62 10/28/2020: BUN 21; Creatinine, Ser 0.82; Potassium 4.2; Sodium 135   Recent Lipid Panel    Component Value Date/Time   CHOL 111 12/09/2020 1616   TRIG 107.0 12/09/2020 1616   HDL 33.90 (L) 12/09/2020 1616   CHOLHDL 3 12/09/2020 1616   VLDL 21.4 12/09/2020 1616   LDLCALC 55 12/09/2020 1616    Physical Exam:   VS:  BP 110/72   Pulse (!) 103   Ht '5\' 4"'$  (1.626 m)   SpO2 93%    Wt Readings from Last 3 Encounters:  No data found for Wt    General: Well nourished, well developed, in no acute distress Head: Atraumatic, normal size  Eyes: PEERLA, EOMI  Neck: Supple, no JVD Endocrine: No thryomegaly Cardiac: Normal S1, S2; irregular rhythm, no murmurs Lungs: Clear to auscultation bilaterally, no wheezing, rhonchi or rales  Abd: Soft, nontender, no hepatomegaly  Ext: 2+ pitting edema up to mid shins, left greater than right, swollen right knee Musculoskeletal: No deformities, BUE and BLE strength normal and equal Skin: Warm and dry, no rashes   Neuro: Alert and oriented to person, place, time, and situation, CNII-XII grossly intact, no focal deficits  Psych: Normal mood and affect   ASSESSMENT:   Becky Stanley is a 75 y.o. female who presents for the following: 1. Persistent atrial fibrillation ([place])   2. Chronic diastolic heart failure ([place])   3. Hypertrophic cardiomyopathy ([place])   4. Essential hypertension   5. Tobacco abuse      PLAN:   1. Persistent atrial fibrillation (  [place]) -Rate control strategy recommended.  She is without major symptoms from her A. fib.  Rate 103 today on EKG.  She will continue metoprolol tartrate 200 mg twice daily.  She is anticoagulated due to her history of apical variant-hypertrophic cardiomyopathy.  2. Chronic diastolic heart failure ([place]) -Still with significant lower extremity edema.  It is controlled with torsemide.  We increased her to 40 mg twice daily for 5 days at her last visit.  She remains on potassium supplementation.  She is watching her salt intake.  She reports that her legs are always worse in the evenings.  For now we will continue with 40 mg daily.  She will take an extra tablet as needed.  I have encouraged her to wear compression stockings as well as to elevate her legs much as possible.  She is an active.  She uses a Lawyer.  We will continue with a more conservative approach.  She will let us know if she develops any venous ulcers.  3. Hypertrophic cardiomyopathy ([place]) -Apical variant hypertrophic cardiomyopathy.  Has an apical aneurysm.  Not interested in an ICD.  4. Essential hypertension -Well-controlled on current medications.  5. Tobacco abuse -Smoking cessation counseling provided.   Disposition: Return in about 6 months (around 08/12/2021).  Medication Adjustments/Labs and Tests Ordered: Current medicines are reviewed at length with the patient today.  Concerns regarding medicines are outlined above.  Orders Placed This Encounter  Procedures   EKG 12-Lead    Meds ordered this encounter  Medications   Torsemide 40 MG TABS    Sig: Take 40 mg by mouth daily. Please take an extra '40mg'$  a day as needed for swelling or weight gain.    Dispense:  180 tablet    Refill:  3     Patient Instructions  Medication Instructions:  YOU MAY TAKE AN EXTRA TABLET OF TORSEMIDE ('40mg'$ ) DAILY AS NEEDED FOR SWELLING OR WEIGHT GAIN  *If you need a refill on your  cardiac medications before your next appointment, please call your pharmacy*  Follow-Up: At [facility], you and your health needs are our priority.  As part of our continuing mission to provide you with exceptional heart care, we have created designated Provider Care Teams.  These Care Teams include your primary Cardiologist (physician) and Advanced Practice Providers (APPs -  Physician Assistants and Nurse Practitioners) who all work together to provide you with the care you need, when you need it.  We recommend signing up for the patient portal called "MyChart".  Sign up information is provided on this After Visit Summary.  MyChart is used to connect with patients for Virtual Visits (Telemedicine).  Patients are able to view lab/test results, encounter notes, upcoming appointments, etc.  Non-urgent messages can be sent to your provider as well.   To learn more about what you can do with MyChart, go to [email or web address].    Your next appointment:   6 month(s)  The format for your next appointment:   In Person  Provider:   Eleonore Chiquito, MD  Other Instructions PLEASE [place].    Time Spent with Patient: I have spent a total of 35 minutes with patient reviewing hospital notes, telemetry, EKGs, labs and examining the patient as well as establishing an assessment and plan that was discussed with the patient.  > 50% of time was spent in direct patient care.  Signed, Addison Naegeli. Audie Box, MD, [place]  [address]  [place], [place] [address] [phone]  02/09/2021 4:39 PM

## 2021-02-09 ENCOUNTER — Other Ambulatory Visit: Payer: Self-pay

## 2021-02-09 ENCOUNTER — Encounter: Payer: Self-pay | Admitting: Cardiovascular Disease

## 2021-02-09 ENCOUNTER — Ambulatory Visit (INDEPENDENT_AMBULATORY_CARE_PROVIDER_SITE_OTHER): Payer: Medicare Other | Admitting: Cardiovascular Disease

## 2021-02-09 VITALS — BP 110/72 | HR 103 | Ht 64.0 in

## 2021-02-09 DIAGNOSIS — I4819 Other persistent atrial fibrillation: Secondary | ICD-10-CM

## 2021-02-09 DIAGNOSIS — I10 Essential (primary) hypertension: Secondary | ICD-10-CM | POA: Diagnosis not present

## 2021-02-09 DIAGNOSIS — I5032 Chronic diastolic (congestive) heart failure: Secondary | ICD-10-CM

## 2021-02-09 DIAGNOSIS — I422 Other hypertrophic cardiomyopathy: Secondary | ICD-10-CM

## 2021-02-09 DIAGNOSIS — Z720 Tobacco use: Secondary | ICD-10-CM

## 2021-02-09 MED ORDER — TORSEMIDE 40 MG PO TABS: 40.0000 mg | ORAL_TABLET | Freq: Every day | ORAL | 3 refills | Status: DC

## 2021-02-09 NOTE — Patient Instructions (Signed)
Medication Instructions:  YOU MAY TAKE AN EXTRA TABLET OF TORSEMIDE ('40mg'$ ) DAILY AS NEEDED FOR SWELLING OR WEIGHT GAIN  *If you need a refill on your cardiac medications before your next appointment, please call your pharmacy*  Follow-Up: At [facility], you and your health needs are our priority.  As part of our continuing mission to provide you with exceptional heart care, we have created designated Provider Care Teams.  These Care Teams include your primary Cardiologist (physician) and Advanced Practice Providers (APPs -  Physician Assistants and Nurse Practitioners) who all work together to provide you with the care you need, when you need it.  We recommend signing up for the patient portal called "MyChart".  Sign up information is provided on this After Visit Summary.  MyChart is used to connect with patients for Virtual Visits (Telemedicine).  Patients are able to view lab/test results, encounter notes, upcoming appointments, etc.  Non-urgent messages can be sent to your provider as well.   To learn more about what you can do with MyChart, go to [email or web address].    Your next appointment:   6 month(s)  The format for your next appointment:   In Person  Provider:   Eleonore Chiquito, MD  Other Instructions PLEASE [place].

## 2021-02-25 ENCOUNTER — Ambulatory Visit: Payer: Medicare Other | Admitting: Cardiovascular Disease

## 2021-03-04 ENCOUNTER — Ambulatory Visit (INDEPENDENT_AMBULATORY_CARE_PROVIDER_SITE_OTHER): Payer: Medicare Other | Admitting: Pulmonary Disease

## 2021-03-04 ENCOUNTER — Encounter: Payer: Self-pay | Admitting: Pulmonary Disease

## 2021-03-04 ENCOUNTER — Other Ambulatory Visit: Payer: Self-pay

## 2021-03-04 VITALS — BP 130/68 | HR 116 | Temp 98.0°F

## 2021-03-04 DIAGNOSIS — Z720 Tobacco use: Secondary | ICD-10-CM

## 2021-03-04 DIAGNOSIS — J9611 Chronic respiratory failure with hypoxia: Secondary | ICD-10-CM | POA: Diagnosis not present

## 2021-03-04 DIAGNOSIS — R0902 Hypoxemia: Secondary | ICD-10-CM

## 2021-03-04 DIAGNOSIS — Z7189 Other specified counseling: Secondary | ICD-10-CM

## 2021-03-04 DIAGNOSIS — J449 Chronic obstructive pulmonary disease, unspecified: Secondary | ICD-10-CM | POA: Diagnosis not present

## 2021-03-04 MED ORDER — TRELEGY ELLIPTA 100-62.5-25 MCG/INH IN AEPB
1.0000 | INHALATION_SPRAY | Freq: Every day | RESPIRATORY_TRACT | 0 refills | Status: DC
Start: 2021-03-04 — End: 2021-10-19

## 2021-03-04 NOTE — Patient Instructions (Addendum)
COPD  --CONTINUE Trelegy 100-62.5-25 ONE puff ONCE a day --CONTINUE Albuterol as needed shortness of breath or wheezing. --Encourage regular activity including daily walks in or out of the house  Exertional hypoxemia --Recommend 1L O2 via [place] with activity and sleep  Tobacco abuse --STOP Vaping!  Follow-up with me in 6 month

## 2021-03-04 NOTE — Progress Notes (Signed)
Subjective:   PATIENT ID: Becky Stanley GENDER: unknown DOB: Oct 20, 1945, MRN: [id number]   HPI  Chief Complaint  Patient presents with   Follow-up    Pt states she has an increase of SOB during the last 6 months, inhaler isn't working because most SOB is in shower. She also has swelling in her legs.     Reason for Visit: Follow-up COPD  [name] is a 75 year old female with HCM, chronic diastolic heart failure, atrial fibrillation who presents for follow-up. Daughter present and provided additional history with patient.  Synopsis:  She was previously followed by Pulmonology in [place]. She was diagnosed with COPD three years ago when she was hospitalized.  Smoking history: 30 pack-year history and quit 20 years ago however started vaping 5 years ago due to stressors. She is interested in quitting but acknowledges that she is having difficulty with motivation. Not able to take wellbutrin (caused reflux) and not interested in chantix (heard about hallucinations/agitation)   10/28/20 Since our last visit, she continues to feel short of breath. Compliant with Trelegy and feels it is helping. Denies wheezing or cough whenever she consistently uses her inhaler. Rarely needs rescue inhaler. Denies COPD exacerbations in the last year. She continues to vape. She has lower extremity swelling that is difficult to control. She plans to see Dr. Audie Box as soon as she is able for evaluation.  03/04/21 Since our last visit she reports her shortness of breath, productive cough and wheezing has worsened. She had a fall and since then her lower extremity swelling has worsened as well. She has seen her Cardiologist and has increased her diuretic use. Now wearing her compression stockings. Compliant with wheezing. She is less active compared to last year and hardly stands to walk. She is not interested in participating in rehab or more strenuous exercise at this point in her life. We discussed GOC  including her desire for DNR, no chest compressions or mechanical ventilation.   Past Medical History:  Diagnosis Date   Anxiety and depression    Carpal tunnel syndrome, left    Chronic UTI, h/o    COPD (chronic obstructive pulmonary disease) (HCC)    Diabetes mellitus (HCC)    DJD (degenerative joint disease)    Essential hypertension    GERD (gastroesophageal reflux disease)    Hyperlipidemia    Hypertrophic cardiomyopathy (HCC)    Iron deficiency anemia    Vitamin D deficiency      Outpatient Medications Prior to Visit  Medication Sig Dispense Refill   albuterol (ACCUNEB) 0.63 MG/3ML nebulizer solution Take 3 mLs (0.63 mg total) by nebulization 4 (four) times daily as needed for wheezing or shortness of breath. 75 mL 2   albuterol (VENTOLIN HFA) 108 (90 Base) MCG/ACT inhaler Inhale 1-2 puffs into the lungs every 4 (four) hours as needed for wheezing or shortness of breath. 1 each 3   alum & mag hydroxide-simeth (MAALOX/MYLANTA) 200-200-20 MG/5ML suspension Take 15 mLs by mouth every 6 (six) hours as needed for indigestion or heartburn.     apixaban (ELIQUIS) 5 MG TABS tablet Take 1 tablet (5 mg total) by mouth 2 (two) times daily. 180 tablet 1   clonazePAM (KLONOPIN) 1 MG tablet Take 1 tablet (1 mg total) by mouth 2 (two) times daily as needed for anxiety. 60 tablet 2   esomeprazole (NEXIUM) 20 MG capsule Take 20 mg by mouth 2 (two) times daily before a meal.     Fluticasone-Umeclidin-Vilant (TRELEGY  ELLIPTA) 100-62.5-25 MCG/INH AEPB Inhale 1 puff into the lungs daily. 60 each 5   glimepiride (AMARYL) 4 MG tablet Take 0.5 tablets (2 mg total) by mouth daily with breakfast AND 1 tablet (4 mg total) every evening. 135 tablet 1   glucose blood (ONETOUCH VERIO) test strip USE TO CHECK BLOOD SUGAR ONCE DAILY 100 strip 12   HYDROcodone-acetaminophen (NORCO/VICODIN) 5-325 MG tablet Take 1 tablet by mouth every 6 (six) hours as needed for moderate pain. 30 tablet 0   levofloxacin  (LEVAQUIN) 250 MG tablet Take 1 tablet (250 mg total) by mouth daily as needed. x3 days each episode of UTI 30 tablet 0   lovastatin (MEVACOR) 40 MG tablet Take 1 tablet (40 mg total) by mouth at bedtime. 90 tablet 1   metFORMIN (GLUCOPHAGE-XR) 500 MG 24 hr tablet Take 2 tablets (1,000 mg total) by mouth 2 (two) times daily. 360 tablet 1   metoprolol tartrate (LOPRESSOR) 100 MG tablet TAKE 2 TABLETS (200 MG TOTAL) BY MOUTH 2 (TWO) TIMES DAILY. 360 tablet 1   mupirocin ointment (BACTROBAN) 2 % Apply 1 application topically 2 (two) times daily as needed. 22 g 0   OneTouch Delica Lancets 99991111 MISC Check blood sugars once daily 100 each 12   potassium chloride SA (KLOR-CON M20) 20 MEQ tablet Take 1 tablet (20 mEq total) by mouth daily. 90 tablet 3   Probiotic Product (PROBIOTIC-10 PO) Take 1 capsule by mouth daily.     sertraline (ZOLOFT) 100 MG tablet Take 1.5 tablets (150 mg total) by mouth daily. 135 tablet 1   terbinafine (LAMISIL) 1 % cream Apply 1 application topically 2 (two) times daily. 60 g 3   Torsemide 40 MG TABS Take 40 mg by mouth daily. Please take an extra '40mg'$  a day as needed for swelling or weight gain. 180 tablet 3   No facility-administered medications prior to visit.    Review of Systems  Constitutional:  Positive for malaise/fatigue. Negative for chills, diaphoresis, fever and weight loss.  HENT:  Negative for congestion.   Respiratory:  Positive for cough, shortness of breath and wheezing. Negative for hemoptysis and sputum production.   Cardiovascular:  Positive for leg swelling. Negative for chest pain and palpitations.  Objective:   Vitals:   03/04/21 1617  BP: 130/68  Pulse: (!) 116  Temp: 98 F (36.7 C)  TempSrc: Oral  SpO2: 93%  There is no height or weight on file to calculate BMI.     Physical Exam: General: Well-appearing, no acute distress, sitting in motor scooter HENT: [place], AT Eyes: EOMI, no scleral icterus Respiratory: Diminished breath sounds  bilaterally.  No crackles, wheezing or rales Cardiovascular: RRR, -M/R/G, no JVD Extremities: 1+ pitting edema in lower extremities,-tenderness Neuro: AAO x4, CNII-XII grossly intact Psych: Normal mood, normal affect  Data Reviewed:  Imaging: CXR 06/15/2019 - No infiltrate, effusion or edema. Minimal lingula atelectasis CXR 10/28/2020 - Chronic interstitial thickening, lingula atelectasis. No infiltrate, effusion or edema  PFT: None on file  Echocardiogram: EF 50 to 55%.  Mild LVH.  Grade 2 diastolic dysfunction.  No evidence of significant valvular disease     Assessment & Plan:   Discussion: 75 year old female active smoker with COPD of unknown severity with chronic bronchitis, HCM, chronic diastolic heart failure and atrial fib who presents for follow-up.  Her symptoms are better controlled on Trelegy however remains to have daily bronchitis symptoms.  Sedentary lifestyle exacerbates this.  She is not as active as she was a  year ago.  Ambulatory O2 demonstrates new oxygen need however patient declines therapy.  She also declined pulmonary rehab.  After discussion of goals of care, she wishes to only pursue symptomatic management and does not necessarily want to be aggressive with her care.  She confirmed that her code status is DNR.   COPD - uncontrolled but improved on bronchodilators --CONTINUE Trelegy 100-62.5-25 ONE puff ONCE a day.  --CONTINUE Albuterol as needed shortness of breath or wheezing. --Encourage regular activity including daily walks in or out of the house --She declined Pulmonary Rehab  Exertional hypoxemia --Recommend 1L O2 via  with activity and sleep --Patient declined oxygen. Ok to order if she decides to start  Chronic diastolic heart failure exacerbation --Continue follow-up with Cardiology  Tobacco abuse Patient is an active smoker.  Not interested in quitting We discussed smoking cessation for 5 minutes. We discussed triggers and stressors and ways  to deal with them. We discussed barriers to continued smoking and benefits of smoking cessation.   Goals of Care Patient expresses understanding of her chronic medical conditions including progression of her symptoms by declining treatment. She wishes to be DNR. No CPR or mechanical ventilation. Family/Daughter present and acknowledges decision.   Health Maintenance Immunization History  Administered Date(s) Administered   Fluad Quad(high Dose 65+) 08/20/2020   Influenza, High Dose Seasonal PF 06/20/2019   Influenza-Unspecified 05/16/2006, 04/17/2009, 06/01/2010   PFIZER(Purple Top)SARS-COV-2 Vaccination 09/09/2019, 10/09/2019   Pneumococcal-Unspecified 03/30/2007   Tdap 08/22/2018   CT Lung Screen - will discuss at next visit. Declined this time  No orders of the defined types were placed in this encounter.  Meds ordered this encounter  Medications   Fluticasone-Umeclidin-Vilant (TRELEGY ELLIPTA) 100-62.5-25 MCG/INH AEPB    Sig: Inhale 1 puff into the lungs daily.    Dispense:  1 each    Refill:  0    Order Specific Question:   Lot Number?    Answer:   [id number]    Order Specific Question:   Expiration Date?    Answer:   08/12/2022    Order Specific Question:   Manufacturer?    Answer:   [employer] [[id number]]    Order Specific Question:   Quantity    Answer:   2    Return in about 6 months (around 09/04/2021).   I have spent a total time of 45-minutes on the day of the appointment reviewing prior documentation, coordinating care and discussing medical diagnosis and plan with the patient/family. Past medical history, allergies, medications were reviewed. Pertinent imaging, labs and tests included in this note have been reviewed and interpreted independently by me.   [place], MD [facility] 03/04/2021 10:30 AM  Office Number [phone]

## 2021-03-05 ENCOUNTER — Encounter: Payer: Self-pay | Admitting: Pulmonary Disease

## 2021-03-05 DIAGNOSIS — J9611 Chronic respiratory failure with hypoxia: Secondary | ICD-10-CM | POA: Insufficient documentation

## 2021-03-13 ENCOUNTER — Telehealth: Payer: Self-pay

## 2021-03-13 NOTE — Telephone Encounter (Signed)
Pt called stating Dr. Larose Kells had filled nystatin cream for her in the past for itching/breakouts under her breast area.  She stated that is happening again in that area and in other places and she thought she had refills, but she does not and would like a refill sent to [facility] [place].

## 2021-03-17 MED ORDER — TERBINAFINE HCL 1 % EX CREA: 1.0000 "application " | TOPICAL_CREAM | Freq: Two times a day (BID) | CUTANEOUS | 3 refills | Status: DC

## 2021-03-17 NOTE — Telephone Encounter (Signed)
Please advise 

## 2021-03-17 NOTE — Telephone Encounter (Signed)
I sent Lamisil with refills 08-2020, I will send it again.

## 2021-03-20 ENCOUNTER — Telehealth: Payer: Self-pay | Admitting: Internal Medicine

## 2021-03-20 MED ORDER — TERBINAFINE HCL 1 % EX CREA: 1.0000 "application " | TOPICAL_CREAM | Freq: Two times a day (BID) | CUTANEOUS | 2 refills | Status: AC

## 2021-03-20 NOTE — Telephone Encounter (Signed)
Advise patient, I sent a different cream

## 2021-03-20 NOTE — Telephone Encounter (Signed)
Pt called to have medication nystatin (MYCOSTATIN) 100000 UNIT/ML suspension dnystatin (MYCOSTATIN) 100000 UNIT/ML suspension  refilled. Medication is currently discontinued. Pt would like an alternate option. Please advise.

## 2021-03-20 NOTE — Telephone Encounter (Signed)
Please advise 

## 2021-04-30 ENCOUNTER — Other Ambulatory Visit: Payer: Self-pay | Admitting: Cardiovascular Disease

## 2021-05-04 ENCOUNTER — Other Ambulatory Visit: Payer: Self-pay

## 2021-05-04 ENCOUNTER — Ambulatory Visit (INDEPENDENT_AMBULATORY_CARE_PROVIDER_SITE_OTHER): Payer: Medicare Other | Admitting: Internal Medicine

## 2021-05-04 ENCOUNTER — Encounter: Payer: Self-pay | Admitting: Internal Medicine

## 2021-05-04 VITALS — BP 126/84 | HR 101 | Temp 97.7°F | Resp 18 | Ht 64.0 in

## 2021-05-04 DIAGNOSIS — F419 Anxiety disorder, unspecified: Secondary | ICD-10-CM | POA: Diagnosis not present

## 2021-05-04 DIAGNOSIS — F32A Depression, unspecified: Secondary | ICD-10-CM

## 2021-05-04 DIAGNOSIS — E118 Type 2 diabetes mellitus with unspecified complications: Secondary | ICD-10-CM

## 2021-05-04 DIAGNOSIS — B369 Superficial mycosis, unspecified: Secondary | ICD-10-CM | POA: Diagnosis not present

## 2021-05-04 DIAGNOSIS — E538 Deficiency of other specified B group vitamins: Secondary | ICD-10-CM | POA: Diagnosis not present

## 2021-05-04 DIAGNOSIS — I421 Obstructive hypertrophic cardiomyopathy: Secondary | ICD-10-CM | POA: Diagnosis not present

## 2021-05-04 DIAGNOSIS — Z79899 Other long term (current) drug therapy: Secondary | ICD-10-CM

## 2021-05-04 DIAGNOSIS — B370 Candidal stomatitis: Secondary | ICD-10-CM | POA: Diagnosis not present

## 2021-05-04 DIAGNOSIS — E559 Vitamin D deficiency, unspecified: Secondary | ICD-10-CM | POA: Diagnosis not present

## 2021-05-04 DIAGNOSIS — Q383 Other congenital malformations of tongue: Secondary | ICD-10-CM | POA: Diagnosis not present

## 2021-05-04 MED ORDER — NYSTATIN 100000 UNIT/ML MT SUSP: 5.0000 mL | Freq: Four times a day (QID) | OROMUCOSAL | 2 refills | Status: AC

## 2021-05-04 MED ORDER — NYSTATIN 100000 UNIT/GM EX OINT: 1.0000 "application " | TOPICAL_OINTMENT | Freq: Two times a day (BID) | CUTANEOUS | 3 refills | Status: AC

## 2021-05-04 NOTE — Patient Instructions (Addendum)
Recommend to proceed with the following vaccines at your pharmacy:  Shingrix (shingles) New covid booster  Per our records you are due for your diabetic eye exam. Please contact your eye doctor to schedule an appointment. Please have them send copies of your office visit notes to [place]. Our fax number is [phone]. If you need a referral to an eye doctor please let us know.   Diabetes:  -Stop glimepiride -Continue metformin  Check your blood sugar once a day or more    -early in AM fasting - Or 2 hours after a meal   GOALS: --If you checked AM fasting : 70- 130 --If you checked 2 hours after a meal: less than 180   I sent nystatin to be used as needed (a ointment a oral suspension)   Check the  blood pressure regularly BP GOAL is between 110/65 and  135/85. If it is consistently higher or lower, let me know    GO TO THE LAB : Get the blood work     [place], [place] back for   a checkup in 5 months

## 2021-05-04 NOTE — Progress Notes (Signed)
Subjective:    Patient ID: Becky Stanley, adult    DOB: 09/01/1945, 75 y.o.   MRN: [id number]  DOS:  05/04/2021 Type of visit - description: f/u, here with her daughter.  We discussed multiple issues, notes from cardiology and pulmonary reviewed. Changing her diet, eating much less carbohydrate, CBGs dropping in the 40s. She self decrease metformin and glimepiride to half dose , CBGs still in the low side.  She skipped medicines last night and this morning her sugar was 200.  Continue with thrush and recurrent skin infections  Review of Systems Denies chest pain, DOE at baseline Edema still there but not worse.  Past Medical History:  Diagnosis Date   Anxiety and depression    Carpal tunnel syndrome, left    Chronic UTI, h/o    COPD (chronic obstructive pulmonary disease) (HCC)    Diabetes mellitus (HCC)    DJD (degenerative joint disease)    Essential hypertension    GERD (gastroesophageal reflux disease)    Hyperlipidemia    Hypertrophic cardiomyopathy (HCC)    Iron deficiency anemia    Vitamin D deficiency     Past Surgical History:  Procedure Laterality Date   CHOLECYSTECTOMY     ESOPHAGEAL DILATION     multiple procedures per Pt   TOTAL HIP ARTHROPLASTY Bilateral ~2010 ??    Allergies as of 05/04/2021       Reactions   Aminoglycosides    Ciprofloxacin Nausea And Vomiting   Tolerates levaquin   Erythromycin Nausea And Vomiting   Nitrofurantoin Nausea And Vomiting   Other Nausea And Vomiting   Mussels   Penicillins Hives        Medication List        Accurate as of May 04, 2021 11:59 PM. If you have any questions, ask your nurse or doctor.          STOP taking these medications    glimepiride 4 MG tablet Commonly known as: AMARYL Stopped by: Kathlene November, MD   HYDROcodone-acetaminophen 5-325 MG tablet Commonly known as: NORCO/VICODIN Stopped by: Kathlene November, MD       TAKE these medications    albuterol 108 (90 Base) MCG/ACT  inhaler Commonly known as: Ventolin HFA Inhale 1-2 puffs into the lungs every 4 (four) hours as needed for wheezing or shortness of breath.   albuterol 0.63 MG/3ML nebulizer solution Commonly known as: ACCUNEB Take 3 mLs (0.63 mg total) by nebulization 4 (four) times daily as needed for wheezing or shortness of breath.   alum & mag hydroxide-simeth 200-200-20 MG/5ML suspension Commonly known as: MAALOX/MYLANTA Take 15 mLs by mouth every 6 (six) hours as needed for indigestion or heartburn.   clonazePAM 1 MG tablet Commonly known as: KLONOPIN Take 1 tablet (1 mg total) by mouth 2 (two) times daily as needed for anxiety.   Eliquis 5 MG Tabs tablet Generic drug: apixaban Take 1 tablet (5 mg total) by mouth 2 (two) times daily.   esomeprazole 20 MG capsule Commonly known as: NEXIUM Take 20 mg by mouth 2 (two) times daily before a meal.   levofloxacin 250 MG tablet Commonly known as: LEVAQUIN Take 1 tablet (250 mg total) by mouth daily as needed. x3 days each episode of UTI   lovastatin 40 MG tablet Commonly known as: MEVACOR Take 1 tablet (40 mg total) by mouth at bedtime.   metFORMIN 500 MG 24 hr tablet Commonly known as: GLUCOPHAGE-XR Take 2 tablets (1,000 mg total) by mouth 2 (two)  times daily.   metoprolol tartrate 100 MG tablet Commonly known as: LOPRESSOR TAKE 2 TABLETS BY MOUTH 2 TIMES DAILY.   mupirocin ointment 2 % Commonly known as: BACTROBAN Apply 1 application topically 2 (two) times daily as needed.   nystatin 100000 UNIT/ML suspension Commonly known as: MYCOSTATIN Take 5 mLs (500,000 Units total) by mouth 4 (four) times daily. As needed for thrush Started by: Kathlene November, MD   nystatin ointment Commonly known as: MYCOSTATIN Apply 1 application topically 2 (two) times daily. Started by: Kathlene November, MD   OneTouch Delica Lancets 09B Misc Check blood sugars once daily   OneTouch Verio test strip Generic drug: glucose blood USE TO CHECK BLOOD SUGAR ONCE  DAILY   potassium chloride SA 20 MEQ tablet Commonly known as: [place]-Con M20 Take 1 tablet (20 mEq total) by mouth daily.   PROBIOTIC-10 PO Take 1 capsule by mouth daily.   sertraline 100 MG tablet Commonly known as: ZOLOFT Take 1.5 tablets (150 mg total) by mouth daily.   terbinafine 1 % cream Commonly known as: LAMISIL Apply 1 application topically 2 (two) times daily.   Torsemide 40 MG Tabs Take 40 mg by mouth daily. Please take an extra 40mg  a day as needed for swelling or weight gain.   Trelegy Ellipta 100-62.5-25 MCG/ACT Aepb Generic drug: Fluticasone-Umeclidin-Vilant Inhale 1 puff into the lungs daily.   Trelegy Ellipta 100-62.5-25 MCG/ACT Aepb Generic drug: Fluticasone-Umeclidin-Vilant Inhale 1 puff into the lungs daily.           Objective:   Physical Exam BP 126/84 (BP Location: Left Arm, Patient Position: Sitting, Cuff Size: Normal)   Pulse (!) 101   Temp 97.7 F (36.5 C) (Oral)   Resp 18   Ht 5\' 4"  (1.626 m)   SpO2 91%  General:   Well developed, NAD, BMI noted. HEENT:  Normocephalic . Face symmetric, atraumatic Throat: No white lesions, tongue is flat. Lungs:  CTA B Normal respiratory effort, no intercostal retractions, no accessory muscle use. Heart: Irregularly irregular.  Lower extremities: +/+++ Pitting edema, worse on the left. Skin: Not pale. Not jaundice Neurologic:  alert & oriented X3.  Speech normal, gait not tested, sits in scooter.   Psych--  Cognition and judgment appear intact.  Cooperative with normal attention span and concentration.  Behavior appropriate. No anxious or depressed appearing.      Assessment      ASSESSMENT (new patient 10-2018 referred by her daughter [name]) DM HTN High cholesterol Alcoholic sober since 3532, [place] meetings  Anxiety: On sertraline, clonazepam Pulmonary: Asthma, COPD, smoker/vaping ----admitted 06/2017: Acute hypoxic respiratory failure ----CT of the chest in August 2017 which revealed  mild groundglass changes primarily in the upper lobes with no noted masses, nodules, or lesions Iron deficiency anemia CV: -CHF DX 06/2017 -HCOM ( avoid excessive diuresis) -Atrial fibrillation, new DX 07/27/2019 -- LE edema L>R DJD Migraines: On hydrocodone as needed Recurrent UTIs: At some point saw ID, Rx Levaquin for 3 days as needed.  She is allergic to Cipro but tolerates Levaquin.  As of 10-2018, UTIs are less frequent. Uses a scooter since hip surgery ~ 2010  PLAN: DM: Since he changed her diet and eating less carbohydrates, CBGs dropping in 40s, see HPI. Recommend to continue with healthier diet, stop glimepiride, continue metformin XR 2 tablets twice daily.  Monitor CBGs, see instructions. COPD, exertional hypoxia: Saw pulmonary 03/04/2021, no changes made.  Continue oxygen with activity and sleep. A. fib, CHF, hypertrophic cardiomyopathy Saw cardiology 02/09/2021,  rec to continue beta-blockers and anticoagulation. Edema noted at the time, on torsemide 40 mg and an extra tablet as needed. Compression stockings encouraged On today exams, she continue with some edema according to the patient is not worse. Check CMP. Recurrent fungal skin infection and thrush: Continue good oral hygiene after inhalers, okay to have nystatin ointment and a oral solution to use as needed. Vitamin D deficiency: Checking labs Abnormal tongue: tongue sems flat, suspect vitamin deficiency, check U98 folic acid. Declines any immunizations today, benefits discussed. RTC 5 months   Today I spent 40 minutes with the patient and her daughter.  We addressed every concerts she had, addressed all her chronic and recurrent medical issues, chart was reviewed.  We had a long discussion with her about immunizations and the need to follow-up regularly. Multiple questions answered. This visit occurred during the SARS-CoV-2 public health emergency.  Safety protocols were in place, including screening questions prior to  the visit, additional usage of staff PPE, and extensive cleaning of exam room while observing appropriate contact time as indicated for disinfecting solutions.

## 2021-05-05 LAB — COMPREHENSIVE METABOLIC PANEL
ALT: 9 U/L (ref 0–35)
AST: 27 U/L (ref 0–37)
Albumin: 4 g/dL (ref 3.5–5.2)
Alkaline Phosphatase: 49 U/L (ref 39–117)
BUN: 23 mg/dL (ref 6–23)
CO2: 25 mEq/L (ref 19–32)
Calcium: 9.4 mg/dL (ref 8.4–10.5)
Chloride: 97 mEq/L (ref 96–112)
Creatinine, Ser: 1.22 mg/dL — ABNORMAL HIGH (ref 0.40–1.20)
GFR: 47 mL/min — ABNORMAL LOW (ref >60.00)
Glucose, Bld: 123 mg/dL — ABNORMAL HIGH (ref 70–99)
Potassium: 3.9 mEq/L (ref 3.5–5.1)
Sodium: 136 mEq/L (ref 135–145)
Total Bilirubin: 0.6 mg/dL (ref 0.2–1.2)
Total Protein: 7.5 g/dL (ref 6.0–8.3)

## 2021-05-05 LAB — HEMOGLOBIN A1C: Hgb A1c MFr Bld: 6.4 % (ref 4.6–6.5)

## 2021-05-05 LAB — VITAMIN D 25 HYDROXY (VIT D DEFICIENCY, FRACTURES): VITD: 27.41 ng/mL — ABNORMAL LOW (ref 30.00–100.00)

## 2021-05-05 LAB — B12 AND FOLATE PANEL
Folate: 7.5 ng/mL (ref >5.9)
Vitamin B-12: 129 pg/mL — ABNORMAL LOW (ref 211–911)

## 2021-05-05 NOTE — Assessment & Plan Note (Signed)
DM: Since he changed her diet and eating less carbohydrates, CBGs dropping in 40s, see HPI. Recommend to continue with healthier diet, stop glimepiride, continue metformin XR 2 tablets twice daily.  Monitor CBGs, see instructions. COPD, exertional hypoxia: Saw pulmonary 03/04/2021, no changes made.  Continue oxygen with activity and sleep. A. fib, CHF, hypertrophic cardiomyopathy Saw cardiology 02/09/2021, rec to continue beta-blockers and anticoagulation. Edema noted at the time, on torsemide 40 mg and an extra tablet as needed. Compression stockings encouraged On today exams, she continue with some edema according to the patient is not worse. Check CMP. Recurrent fungal skin infection and thrush: Continue good oral hygiene after inhalers, okay to have nystatin ointment and a oral solution to use as needed. Vitamin D deficiency: Checking labs Abnormal tongue: tongue sems flat, suspect vitamin deficiency, check T01 folic acid. Declines any immunizations today, benefits discussed. RTC 5 months

## 2021-05-07 MED ORDER — VITAMIN D (ERGOCALCIFEROL) 1.25 MG (50000 UNIT) PO CAPS: 50000.0000 [IU] | ORAL_CAPSULE | ORAL | 0 refills | Status: DC

## 2021-05-07 NOTE — Addendum Note (Signed)
Addended byDamita Dunnings D on: 05/07/2021 01:06 PM   Modules accepted: Orders

## 2021-05-18 ENCOUNTER — Telehealth: Payer: Self-pay | Admitting: Internal Medicine

## 2021-05-18 DIAGNOSIS — I10 Essential (primary) hypertension: Secondary | ICD-10-CM

## 2021-05-18 DIAGNOSIS — L89202 Pressure ulcer of unspecified hip, stage 2: Secondary | ICD-10-CM

## 2021-05-18 DIAGNOSIS — I5032 Chronic diastolic (congestive) heart failure: Secondary | ICD-10-CM

## 2021-05-18 DIAGNOSIS — E118 Type 2 diabetes mellitus with unspecified complications: Secondary | ICD-10-CM

## 2021-05-18 NOTE — Telephone Encounter (Signed)
Speak with the patient's daughter, the patient has developed what seems to be pressure ulcer stage II at the hip, advice was provided, at the same time we agreed on a home health referral.  DX pressure ulcer.  Please contact [name] the daughter

## 2021-05-18 NOTE — Telephone Encounter (Signed)
Referral placed.

## 2021-05-25 DIAGNOSIS — E7800 Pure hypercholesterolemia, unspecified: Secondary | ICD-10-CM | POA: Diagnosis not present

## 2021-05-25 DIAGNOSIS — Z7984 Long term (current) use of oral hypoglycemic drugs: Secondary | ICD-10-CM | POA: Diagnosis not present

## 2021-05-25 DIAGNOSIS — B370 Candidal stomatitis: Secondary | ICD-10-CM | POA: Diagnosis not present

## 2021-05-25 DIAGNOSIS — Z993 Dependence on wheelchair: Secondary | ICD-10-CM | POA: Diagnosis not present

## 2021-05-25 DIAGNOSIS — F419 Anxiety disorder, unspecified: Secondary | ICD-10-CM | POA: Diagnosis not present

## 2021-05-25 DIAGNOSIS — Z7901 Long term (current) use of anticoagulants: Secondary | ICD-10-CM | POA: Diagnosis not present

## 2021-05-25 DIAGNOSIS — G5602 Carpal tunnel syndrome, left upper limb: Secondary | ICD-10-CM | POA: Diagnosis not present

## 2021-05-25 DIAGNOSIS — J449 Chronic obstructive pulmonary disease, unspecified: Secondary | ICD-10-CM | POA: Diagnosis not present

## 2021-05-25 DIAGNOSIS — G43909 Migraine, unspecified, not intractable, without status migrainosus: Secondary | ICD-10-CM | POA: Diagnosis not present

## 2021-05-25 DIAGNOSIS — I5032 Chronic diastolic (congestive) heart failure: Secondary | ICD-10-CM | POA: Diagnosis not present

## 2021-05-25 DIAGNOSIS — B369 Superficial mycosis, unspecified: Secondary | ICD-10-CM | POA: Diagnosis not present

## 2021-05-25 DIAGNOSIS — Z79899 Other long term (current) drug therapy: Secondary | ICD-10-CM | POA: Diagnosis not present

## 2021-05-25 DIAGNOSIS — F32A Depression, unspecified: Secondary | ICD-10-CM | POA: Diagnosis not present

## 2021-05-25 DIAGNOSIS — D509 Iron deficiency anemia, unspecified: Secondary | ICD-10-CM | POA: Diagnosis not present

## 2021-05-25 DIAGNOSIS — Z9181 History of falling: Secondary | ICD-10-CM | POA: Diagnosis not present

## 2021-05-25 DIAGNOSIS — I4891 Unspecified atrial fibrillation: Secondary | ICD-10-CM | POA: Diagnosis not present

## 2021-05-25 DIAGNOSIS — E559 Vitamin D deficiency, unspecified: Secondary | ICD-10-CM | POA: Diagnosis not present

## 2021-05-25 DIAGNOSIS — E538 Deficiency of other specified B group vitamins: Secondary | ICD-10-CM | POA: Diagnosis not present

## 2021-05-25 DIAGNOSIS — I110 Hypertensive heart disease with heart failure: Secondary | ICD-10-CM | POA: Diagnosis not present

## 2021-05-25 DIAGNOSIS — Q383 Other congenital malformations of tongue: Secondary | ICD-10-CM | POA: Diagnosis not present

## 2021-05-25 DIAGNOSIS — E119 Type 2 diabetes mellitus without complications: Secondary | ICD-10-CM | POA: Diagnosis not present

## 2021-05-25 DIAGNOSIS — I421 Obstructive hypertrophic cardiomyopathy: Secondary | ICD-10-CM | POA: Diagnosis not present

## 2021-05-25 DIAGNOSIS — K219 Gastro-esophageal reflux disease without esophagitis: Secondary | ICD-10-CM | POA: Diagnosis not present

## 2021-05-25 DIAGNOSIS — M1990 Unspecified osteoarthritis, unspecified site: Secondary | ICD-10-CM | POA: Diagnosis not present

## 2021-06-03 ENCOUNTER — Telehealth: Payer: Self-pay | Admitting: Internal Medicine

## 2021-06-03 NOTE — Telephone Encounter (Signed)
Just an FYI

## 2021-06-03 NOTE — Telephone Encounter (Signed)
Gaspar Skeeters from [facility]   Pt completed start of care last week. And was seen on a weekly base for recent medication change. Pt refused for care this week, and will attempt to go back out next. Tele: [phone]

## 2021-06-03 NOTE — Telephone Encounter (Signed)
Noted, thank you

## 2021-06-08 ENCOUNTER — Telehealth: Payer: Self-pay | Admitting: Internal Medicine

## 2021-06-08 NOTE — Telephone Encounter (Signed)
Lenora from home health has been trying to see pt since 11/23, pt keeps refusing. She states pt keeps r/s is a bit unaccommodating and is hard to schedule. She only wants to be seen after 3, nurse has tried to explain she can see her anytime from 8-1 but pt states she needs a more flexible schedule. Gaspar Skeeters stated she will be reached out to pt's daughter to see if something can be worked out. Gaspar Skeeters can be reached at [phone]. Please advise.

## 2021-06-08 NOTE — Telephone Encounter (Signed)
Noted, thank you

## 2021-06-08 NOTE — Telephone Encounter (Signed)
FYI

## 2021-06-09 DIAGNOSIS — E538 Deficiency of other specified B group vitamins: Secondary | ICD-10-CM | POA: Diagnosis not present

## 2021-06-09 DIAGNOSIS — I110 Hypertensive heart disease with heart failure: Secondary | ICD-10-CM | POA: Diagnosis not present

## 2021-06-09 DIAGNOSIS — I5032 Chronic diastolic (congestive) heart failure: Secondary | ICD-10-CM | POA: Diagnosis not present

## 2021-06-09 DIAGNOSIS — E559 Vitamin D deficiency, unspecified: Secondary | ICD-10-CM | POA: Diagnosis not present

## 2021-06-09 DIAGNOSIS — J449 Chronic obstructive pulmonary disease, unspecified: Secondary | ICD-10-CM | POA: Diagnosis not present

## 2021-06-09 DIAGNOSIS — E119 Type 2 diabetes mellitus without complications: Secondary | ICD-10-CM | POA: Diagnosis not present

## 2021-06-17 DIAGNOSIS — E119 Type 2 diabetes mellitus without complications: Secondary | ICD-10-CM | POA: Diagnosis not present

## 2021-06-17 DIAGNOSIS — I5032 Chronic diastolic (congestive) heart failure: Secondary | ICD-10-CM | POA: Diagnosis not present

## 2021-06-17 DIAGNOSIS — E559 Vitamin D deficiency, unspecified: Secondary | ICD-10-CM | POA: Diagnosis not present

## 2021-06-17 DIAGNOSIS — I110 Hypertensive heart disease with heart failure: Secondary | ICD-10-CM | POA: Diagnosis not present

## 2021-06-17 DIAGNOSIS — J449 Chronic obstructive pulmonary disease, unspecified: Secondary | ICD-10-CM | POA: Diagnosis not present

## 2021-06-17 DIAGNOSIS — E538 Deficiency of other specified B group vitamins: Secondary | ICD-10-CM | POA: Diagnosis not present

## 2021-06-18 ENCOUNTER — Telehealth: Payer: Self-pay | Admitting: Internal Medicine

## 2021-06-18 NOTE — Telephone Encounter (Signed)
Spoke w/ Juliann Pulse- verbal orders and recommendations given. She verbalized understanding.

## 2021-06-18 NOTE — Telephone Encounter (Signed)
Please advise on wound care.

## 2021-06-18 NOTE — Telephone Encounter (Signed)
Okay requested referrals.  I have not seen her in person regards her pressure ulcers however I typically recommend: Frequent turning, sheepskin.  Also recommend that the skilled nurse check the area to see if further treatment is needed.  If the family think the ulcer is severe needs office visit

## 2021-06-18 NOTE — Telephone Encounter (Signed)
Caller/Agency: Juliann Pulse RN from [place] Number: [phone] Requesting OT/PT/Skilled Nursing/Social Work/Speech Therapy: nursing Frequency: 1 w 4  And also to apply hydrocoloid, to right buttock weekly and as needed. Due to the sore on patient bottom opening up.

## 2021-06-19 ENCOUNTER — Other Ambulatory Visit: Payer: Self-pay | Admitting: Internal Medicine

## 2021-06-24 DIAGNOSIS — Z7901 Long term (current) use of anticoagulants: Secondary | ICD-10-CM | POA: Diagnosis not present

## 2021-06-24 DIAGNOSIS — Z9181 History of falling: Secondary | ICD-10-CM | POA: Diagnosis not present

## 2021-06-24 DIAGNOSIS — J449 Chronic obstructive pulmonary disease, unspecified: Secondary | ICD-10-CM | POA: Diagnosis not present

## 2021-06-24 DIAGNOSIS — M1990 Unspecified osteoarthritis, unspecified site: Secondary | ICD-10-CM | POA: Diagnosis not present

## 2021-06-24 DIAGNOSIS — E559 Vitamin D deficiency, unspecified: Secondary | ICD-10-CM | POA: Diagnosis not present

## 2021-06-24 DIAGNOSIS — B370 Candidal stomatitis: Secondary | ICD-10-CM | POA: Diagnosis not present

## 2021-06-24 DIAGNOSIS — Z79899 Other long term (current) drug therapy: Secondary | ICD-10-CM | POA: Diagnosis not present

## 2021-06-24 DIAGNOSIS — I110 Hypertensive heart disease with heart failure: Secondary | ICD-10-CM | POA: Diagnosis not present

## 2021-06-24 DIAGNOSIS — I5032 Chronic diastolic (congestive) heart failure: Secondary | ICD-10-CM | POA: Diagnosis not present

## 2021-06-24 DIAGNOSIS — G5602 Carpal tunnel syndrome, left upper limb: Secondary | ICD-10-CM | POA: Diagnosis not present

## 2021-06-24 DIAGNOSIS — E538 Deficiency of other specified B group vitamins: Secondary | ICD-10-CM | POA: Diagnosis not present

## 2021-06-24 DIAGNOSIS — Z7984 Long term (current) use of oral hypoglycemic drugs: Secondary | ICD-10-CM | POA: Diagnosis not present

## 2021-06-24 DIAGNOSIS — B369 Superficial mycosis, unspecified: Secondary | ICD-10-CM | POA: Diagnosis not present

## 2021-06-24 DIAGNOSIS — E119 Type 2 diabetes mellitus without complications: Secondary | ICD-10-CM | POA: Diagnosis not present

## 2021-06-24 DIAGNOSIS — F419 Anxiety disorder, unspecified: Secondary | ICD-10-CM | POA: Diagnosis not present

## 2021-06-24 DIAGNOSIS — Q383 Other congenital malformations of tongue: Secondary | ICD-10-CM | POA: Diagnosis not present

## 2021-06-24 DIAGNOSIS — Z993 Dependence on wheelchair: Secondary | ICD-10-CM | POA: Diagnosis not present

## 2021-06-24 DIAGNOSIS — E7800 Pure hypercholesterolemia, unspecified: Secondary | ICD-10-CM | POA: Diagnosis not present

## 2021-06-24 DIAGNOSIS — I421 Obstructive hypertrophic cardiomyopathy: Secondary | ICD-10-CM | POA: Diagnosis not present

## 2021-06-24 DIAGNOSIS — F32A Depression, unspecified: Secondary | ICD-10-CM | POA: Diagnosis not present

## 2021-06-24 DIAGNOSIS — I4891 Unspecified atrial fibrillation: Secondary | ICD-10-CM | POA: Diagnosis not present

## 2021-06-24 DIAGNOSIS — D509 Iron deficiency anemia, unspecified: Secondary | ICD-10-CM | POA: Diagnosis not present

## 2021-06-24 DIAGNOSIS — G43909 Migraine, unspecified, not intractable, without status migrainosus: Secondary | ICD-10-CM | POA: Diagnosis not present

## 2021-06-24 DIAGNOSIS — K219 Gastro-esophageal reflux disease without esophagitis: Secondary | ICD-10-CM | POA: Diagnosis not present

## 2021-07-02 DIAGNOSIS — K219 Gastro-esophageal reflux disease without esophagitis: Secondary | ICD-10-CM

## 2021-07-02 DIAGNOSIS — Z993 Dependence on wheelchair: Secondary | ICD-10-CM

## 2021-07-02 DIAGNOSIS — F419 Anxiety disorder, unspecified: Secondary | ICD-10-CM | POA: Diagnosis not present

## 2021-07-02 DIAGNOSIS — Z7901 Long term (current) use of anticoagulants: Secondary | ICD-10-CM

## 2021-07-02 DIAGNOSIS — I5032 Chronic diastolic (congestive) heart failure: Secondary | ICD-10-CM | POA: Diagnosis not present

## 2021-07-02 DIAGNOSIS — I110 Hypertensive heart disease with heart failure: Secondary | ICD-10-CM | POA: Diagnosis not present

## 2021-07-02 DIAGNOSIS — Q383 Other congenital malformations of tongue: Secondary | ICD-10-CM | POA: Diagnosis not present

## 2021-07-02 DIAGNOSIS — F32A Depression, unspecified: Secondary | ICD-10-CM | POA: Diagnosis not present

## 2021-07-02 DIAGNOSIS — G43909 Migraine, unspecified, not intractable, without status migrainosus: Secondary | ICD-10-CM

## 2021-07-02 DIAGNOSIS — B369 Superficial mycosis, unspecified: Secondary | ICD-10-CM

## 2021-07-02 DIAGNOSIS — Z9181 History of falling: Secondary | ICD-10-CM

## 2021-07-02 DIAGNOSIS — I4891 Unspecified atrial fibrillation: Secondary | ICD-10-CM

## 2021-07-02 DIAGNOSIS — Z7984 Long term (current) use of oral hypoglycemic drugs: Secondary | ICD-10-CM

## 2021-07-02 DIAGNOSIS — B370 Candidal stomatitis: Secondary | ICD-10-CM | POA: Diagnosis not present

## 2021-07-02 DIAGNOSIS — E7800 Pure hypercholesterolemia, unspecified: Secondary | ICD-10-CM

## 2021-07-02 DIAGNOSIS — E119 Type 2 diabetes mellitus without complications: Secondary | ICD-10-CM | POA: Diagnosis not present

## 2021-07-02 DIAGNOSIS — J449 Chronic obstructive pulmonary disease, unspecified: Secondary | ICD-10-CM | POA: Diagnosis not present

## 2021-07-02 DIAGNOSIS — M1990 Unspecified osteoarthritis, unspecified site: Secondary | ICD-10-CM

## 2021-07-02 DIAGNOSIS — E559 Vitamin D deficiency, unspecified: Secondary | ICD-10-CM | POA: Diagnosis not present

## 2021-07-02 DIAGNOSIS — D509 Iron deficiency anemia, unspecified: Secondary | ICD-10-CM

## 2021-07-02 DIAGNOSIS — I421 Obstructive hypertrophic cardiomyopathy: Secondary | ICD-10-CM | POA: Diagnosis not present

## 2021-07-02 DIAGNOSIS — Z79899 Other long term (current) drug therapy: Secondary | ICD-10-CM

## 2021-07-02 DIAGNOSIS — G5602 Carpal tunnel syndrome, left upper limb: Secondary | ICD-10-CM | POA: Diagnosis not present

## 2021-07-02 DIAGNOSIS — E538 Deficiency of other specified B group vitamins: Secondary | ICD-10-CM | POA: Diagnosis not present

## 2021-07-03 ENCOUNTER — Telehealth: Payer: Self-pay

## 2021-07-03 NOTE — Telephone Encounter (Signed)
Plan of care signed and faxed back to [facility] at [phone]. Form sent for scanning.

## 2021-07-17 ENCOUNTER — Telehealth: Payer: Self-pay | Admitting: Internal Medicine

## 2021-07-17 NOTE — Telephone Encounter (Signed)
Just an FYI

## 2021-07-17 NOTE — Telephone Encounter (Signed)
[facility] would like to make [place] aware that they are done working with the patient since breakdown is healed.

## 2021-07-18 NOTE — Telephone Encounter (Signed)
thx

## 2021-07-25 ENCOUNTER — Other Ambulatory Visit: Payer: Self-pay | Admitting: Internal Medicine

## 2021-07-31 ENCOUNTER — Other Ambulatory Visit: Payer: Self-pay | Admitting: Internal Medicine

## 2021-08-17 ENCOUNTER — Other Ambulatory Visit: Payer: Self-pay | Admitting: Internal Medicine

## 2021-08-17 NOTE — Progress Notes (Deleted)
Cardiology Office Note:   Date:  08/17/2021  NAME:  Becky Stanley    MRN: [id number] DOB:  August 31, 1945   PCP:  Colon Branch, MD  Cardiologist:  Evalina Field, MD  Electrophysiologist:  None   Referring MD: Colon Branch, MD   No chief complaint on file. ***  History of Present Illness:   Becky Stanley is a 76 y.o. female with a hx of apical variant hypertrophic cardiomyopathy, hypertension, HFpEF, diabetes, persistent A-fib, COPD, tobacco abuse who presents for follow-up.  Problem List 1. Apical Hypertrophic Cardiomyopathy  -apical aneurysm  -diagnosed 2018 and no major issues  -no syncope -not wanting ICD so no further SCD evaluation 2. HTN 3. Diastolic heart failure  -EF 50-55% -G2DD 4. Obesity 5.  Diabetes  -A1c 6.4 -T chol 111, HDL 33, LDL 55, TG 107 6. Atrial fibrillation, persistent  -diagnosed 07/30/2019 7. Tobacco abuse  -e cigarettes currently -35+ years smoking  8. COPD  Past Medical History: Past Medical History:  Diagnosis Date   Anxiety and depression    Carpal tunnel syndrome, left    Chronic UTI, h/o    COPD (chronic obstructive pulmonary disease) (HCC)    Diabetes mellitus (HCC)    DJD (degenerative joint disease)    Essential hypertension    GERD (gastroesophageal reflux disease)    Hyperlipidemia    Hypertrophic cardiomyopathy (HCC)    Iron deficiency anemia    Vitamin D deficiency     Past Surgical History: Past Surgical History:  Procedure Laterality Date   CHOLECYSTECTOMY     ESOPHAGEAL DILATION     multiple procedures per Pt   TOTAL HIP ARTHROPLASTY Bilateral ~2010 ??    Current Medications: No outpatient medications have been marked as taking for the 08/18/21 encounter (Appointment) with O'Neal, Cassie Freer, MD.     Allergies:    Aminoglycosides, Ciprofloxacin, Erythromycin, Nitrofurantoin, Other, and Penicillins   Social History: Social History   Socioeconomic History   Marital status: Widowed    Spouse name: Dominica Severin    Number of children: 2   Years of education: Not on file   Highest education level: Not on file  Occupational History   Occupation: retired  Tobacco Use   Smoking status: Former    Packs/day: 1.00    Years: 35.00    Pack years: 35.00    Types: E-cigarettes, Cigarettes    Start date: 07/12/1958    Quit date: 09/10/1998    Years since quitting: 22.9   Smokeless tobacco: Never   Tobacco comments:    quit 20 years, started back about 5 years ago, vaping  Vaping Use   Vaping Use: Every day   Start date: 09/09/2017   Substances: Nicotine  Substance and Sexual Activity   Alcohol use: Never    Comment: h/o ETOH abuse    Drug use: Never   Sexual activity: Not on file  Other Topics Concern   Not on file  Social History Narrative   Moved from [place] to the [place] area home 10/12/2018; lives at her daughter.   Lost husband Dominica Severin 06/2019   Former smoker, as of 10-2018 vaping with plans to quit   Social Determinants of Health   Financial Resource Strain: Not on file  Food Insecurity: Not on file  Transportation Needs: Not on file  Physical Activity: Not on file  Stress: Not on file  Social Connections: Not on file     Family History: The patient's ***family history includes CAD in  her paternal grandfather; COPD in her father; Colon cancer (age of onset: 68) in her father; Multiple sclerosis in her mother. There is no history of Breast cancer.  ROS:   All other ROS reviewed and negative. Pertinent positives noted in the HPI.     EKGs/Labs/Other Studies Reviewed:   The following studies were personally reviewed by me today:  EKG:  EKG is *** ordered today.  The ekg ordered today demonstrates ***, and was personally reviewed by me.   TTE 06/29/2019  1. Left ventricular ejection fraction, by visual estimation, is 50 to  55%. The left ventricle has normal function. There is mildly increased  left ventricular hypertrophy.   2. Elevated left atrial pressure.   3. Left ventricular  diastolic parameters are consistent with Grade II  diastolic dysfunction (pseudonormalization).   4. The left ventricle demonstrates regional wall motion abnormalities.   5. Global right ventricle has normal systolic function.The right  ventricular size is normal.   6. Left atrial size was mildly dilated.   7. Right atrial size was normal.   8. Mild mitral annular calcification.   9. The mitral valve is normal in structure. No evidence of mitral valve  regurgitation. No evidence of mitral stenosis.  10. The tricuspid valve is normal in structure. Tricuspid valve  regurgitation is trivial.  11. The aortic valve has an indeterminant number of cusps. Aortic valve  regurgitation is not visualized. No evidence of aortic valve sclerosis or  stenosis.  12. The pulmonic valve was not well visualized. Pulmonic valve  regurgitation is not visualized.  13. The inferior vena cava is normal in size with greater than 50%  respiratory variability, suggesting right atrial pressure of 3 mmHg.  14. Extremely limited due to poor sound wave transmission; apical akinesis  with turbulence in mid LVOT and apex; overall low normal LV systolic  function; grade 2 diastolic dysfunction; mild LAE; suggest cardiac MRI to  better assess LV function and LV  apex.   Recent Labs: 08/20/2020: Hemoglobin 12.2; Platelets 264.0; TSH 2.62 05/04/2021: ALT 9; BUN 23; Creatinine, Ser 1.22; Potassium 3.9; Sodium 136   Recent Lipid Panel    Component Value Date/Time   CHOL 111 12/09/2020 1616   TRIG 107.0 12/09/2020 1616   HDL 33.90 (L) 12/09/2020 1616   CHOLHDL 3 12/09/2020 1616   VLDL 21.4 12/09/2020 1616   LDLCALC 55 12/09/2020 1616    Physical Exam:   VS:  There were no vitals taken for this visit.   Wt Readings from Last 3 Encounters:  No data found for Wt    General: Well nourished, well developed, in no acute distress Head: Atraumatic, normal size  Eyes: PEERLA, EOMI  Neck: Supple, no JVD Endocrine: No  thryomegaly Cardiac: Normal S1, S2; RRR; no murmurs, rubs, or gallops Lungs: Clear to auscultation bilaterally, no wheezing, rhonchi or rales  Abd: Soft, nontender, no hepatomegaly  Ext: No edema, pulses 2+ Musculoskeletal: No deformities, BUE and BLE strength normal and equal Skin: Warm and dry, no rashes   Neuro: Alert and oriented to person, place, time, and situation, CNII-XII grossly intact, no focal deficits  Psych: Normal mood and affect   ASSESSMENT:   Klarissa A Hosack is a 76 y.o. female who presents for the following: No diagnosis found.  PLAN:   There are no diagnoses linked to this encounter.  {Are you ordering a CV Procedure (e.g. stress test, cath, DCCV, TEE, etc)?   Press F2        :  [id number]}  Disposition: No follow-ups on file.  Medication Adjustments/Labs and Tests Ordered: Current medicines are reviewed at length with the patient today.  Concerns regarding medicines are outlined above.  No orders of the defined types were placed in this encounter.  No orders of the defined types were placed in this encounter.   There are no Patient Instructions on file for this visit.   Time Spent with Patient: I have spent a total of *** minutes with patient reviewing hospital notes, telemetry, EKGs, labs and examining the patient as well as establishing an assessment and plan that was discussed with the patient.  > 50% of time was spent in direct patient care.  Signed, Addison Naegeli. Audie Box, MD, [facility]  [address] [place], [place] [address] [phone]  08/17/2021 2:59 PM

## 2021-08-18 ENCOUNTER — Encounter: Payer: Self-pay | Admitting: Internal Medicine

## 2021-08-18 ENCOUNTER — Ambulatory Visit: Payer: Medicare Other | Admitting: Cardiovascular Disease

## 2021-08-18 ENCOUNTER — Telehealth: Payer: Self-pay | Admitting: Internal Medicine

## 2021-08-18 DIAGNOSIS — I10 Essential (primary) hypertension: Secondary | ICD-10-CM

## 2021-08-18 DIAGNOSIS — I4819 Other persistent atrial fibrillation: Secondary | ICD-10-CM

## 2021-08-18 DIAGNOSIS — D6869 Other thrombophilia: Secondary | ICD-10-CM

## 2021-08-18 DIAGNOSIS — I5032 Chronic diastolic (congestive) heart failure: Secondary | ICD-10-CM

## 2021-08-18 DIAGNOSIS — R399 Unspecified symptoms and signs involving the genitourinary system: Secondary | ICD-10-CM

## 2021-08-18 DIAGNOSIS — Z720 Tobacco use: Secondary | ICD-10-CM

## 2021-08-18 DIAGNOSIS — I422 Other hypertrophic cardiomyopathy: Secondary | ICD-10-CM

## 2021-08-18 NOTE — Telephone Encounter (Signed)
Patient daughter would like to know if a nurse could come to the home and check patients sore on bottom.  Patient daughter also thinks patient has a uti and would like to know if orders or nurse could check as well.

## 2021-08-18 NOTE — Telephone Encounter (Signed)
Spoke w/ [name]- virtual visit scheduled for tomorrow. Urine cup placed at front desk for pick up.

## 2021-08-18 NOTE — Telephone Encounter (Signed)
May take several weeks for home health to come out- recommend to schedule visit with PCP.

## 2021-08-18 NOTE — Telephone Encounter (Signed)
Please arrange a virtual visit

## 2021-08-18 NOTE — Telephone Encounter (Signed)
Patient daughter stated it would be difficult moving her mom to get her in. Patient daughter would like for someone to contact her to explain situation

## 2021-08-19 ENCOUNTER — Telehealth (INDEPENDENT_AMBULATORY_CARE_PROVIDER_SITE_OTHER): Payer: Medicare Other | Admitting: Internal Medicine

## 2021-08-19 ENCOUNTER — Other Ambulatory Visit (INDEPENDENT_AMBULATORY_CARE_PROVIDER_SITE_OTHER): Payer: Medicare Other

## 2021-08-19 ENCOUNTER — Encounter: Payer: Self-pay | Admitting: Internal Medicine

## 2021-08-19 VITALS — BP 118/77 | Temp 98.2°F | Ht 64.0 in

## 2021-08-19 DIAGNOSIS — N390 Urinary tract infection, site not specified: Secondary | ICD-10-CM

## 2021-08-19 DIAGNOSIS — R399 Unspecified symptoms and signs involving the genitourinary system: Secondary | ICD-10-CM | POA: Diagnosis not present

## 2021-08-19 DIAGNOSIS — L8990 Pressure ulcer of unspecified site, unspecified stage: Secondary | ICD-10-CM

## 2021-08-19 LAB — URINALYSIS, ROUTINE W REFLEX MICROSCOPIC
Bilirubin Urine: NEGATIVE
Ketones, ur: NEGATIVE
Nitrite: POSITIVE — AB
Specific Gravity, Urine: 1.015 (ref 1.000–1.030)
Total Protein, Urine: 100 — AB
Urine Glucose: NEGATIVE
Urobilinogen, UA: 0.2 (ref 0.0–1.0)
pH: 8 (ref 5.0–8.0)

## 2021-08-19 MED ORDER — SULFAMETHOXAZOLE-TRIMETHOPRIM 800-160 MG PO TABS: 1.0000 | ORAL_TABLET | Freq: Two times a day (BID) | ORAL | 0 refills | Status: DC

## 2021-08-19 NOTE — Progress Notes (Signed)
Subjective:    Patient ID: Becky Stanley, female    DOB: 07-Aug-1945, 76 y.o.   MRN: [id number]  DOS:  08/19/2021 Type of visit - description: Virtual Visit via Video Note  I connected with the above patient  by a video enabled telemedicine application and verified that I am speaking with the correct person using two identifiers.   THIS ENCOUNTER IS A VIRTUAL VISIT DUE TO COVID-19 - PATIENT WAS NOT SEEN IN THE OFFICE. PATIENT HAS CONSENTED TO VIRTUAL VISIT / TELEMEDICINE VISIT   Location of patient: home  Location of provider: office  Persons participating in the virtual visit: patient, provider   I discussed the limitations of evaluation and management by telemedicine and the availability of in person appointments. The patient expressed understanding and agreed to proceed.  Acute: History of recurrent UTIs, she started with symptoms on and off for the last 6 weeks, has taken a total of 4 tablets of Levaquin without clear-cut improvement. Reports dysuria, suprapubic discomfort. Denies: Fever chills, nausea vomiting I ask about diarrhea and she said that she does have chronic diarrhea.  Also is developing again pressure ulcer.  Reports that there is a coin-size area of redness and broken skin at the buttock.   Review of Systems See above   Past Medical History:  Diagnosis Date   Anxiety and depression    Carpal tunnel syndrome, left    Chronic UTI, h/o    COPD (chronic obstructive pulmonary disease) (HCC)    Diabetes mellitus (HCC)    DJD (degenerative joint disease)    Essential hypertension    GERD (gastroesophageal reflux disease)    Hyperlipidemia    Hypertrophic cardiomyopathy (HCC)    Iron deficiency anemia    Vitamin D deficiency     Past Surgical History:  Procedure Laterality Date   CHOLECYSTECTOMY     ESOPHAGEAL DILATION     multiple procedures per Pt   TOTAL HIP ARTHROPLASTY Bilateral ~2010 ??    Current Outpatient Medications  Medication  Instructions   albuterol (ACCUNEB) 0.63 mg, Nebulization, 4 times daily PRN   albuterol (VENTOLIN HFA) 108 (90 Base) MCG/ACT inhaler 1-2 puffs, Inhalation, Every 4 hours PRN   alum & mag hydroxide-simeth (MAALOX/MYLANTA) 200-200-20 MG/5ML suspension 15 mLs, Oral, Every 6 hours PRN   clonazePAM (KLONOPIN) 1 mg, Oral, 2 times daily PRN   Eliquis 5 mg, Oral, 2 times daily   esomeprazole (NEXIUM) 20 mg, Oral, 2 times daily before meals   Fluticasone-Umeclidin-Vilant (TRELEGY ELLIPTA) 100-62.5-25 MCG/INH AEPB 1 puff, Inhalation, Daily   Fluticasone-Umeclidin-Vilant (TRELEGY ELLIPTA) 100-62.5-25 MCG/INH AEPB 1 puff, Inhalation, Daily   glucose blood (ONETOUCH VERIO) test strip USE TO CHECK BLOOD SUGAR ONCE DAILY   levofloxacin (LEVAQUIN) 250 mg, Oral, Daily PRN, x3 days each episode of UTI   lovastatin (MEVACOR) 40 mg, Oral, Daily at bedtime   metFORMIN (GLUCOPHAGE-XR) 500 MG 24 hr tablet TAKE 2 TABLETS BY MOUTH TWICE A DAY   metoprolol tartrate (LOPRESSOR) 100 MG tablet TAKE 2 TABLETS BY MOUTH 2 TIMES DAILY.   mupirocin ointment (BACTROBAN) 2 % 1 application, Topical, 2 times daily PRN   nystatin (MYCOSTATIN) 500,000 Units, Oral, 4 times daily, As needed for thrush   nystatin ointment (MYCOSTATIN) 1 application, Topical, 2 times daily   OneTouch Delica Lancets 02O MISC Check blood sugars once daily   potassium chloride SA (KLOR-CON M20) 20 MEQ tablet 20 mEq, Oral, Daily   Probiotic Product (PROBIOTIC-10 PO) 1 capsule, Oral, Daily   sertraline (  ZOLOFT) 100 MG tablet TAKE 1.5 TABLETS (150MG  TOTAL) BY MOUTH DAILY   sulfamethoxazole-trimethoprim (BACTRIM DS) 800-160 MG tablet 1 tablet, Oral, 2 times daily   terbinafine (LAMISIL) 1 % cream 1 application, Topical, 2 times daily   Torsemide 40 mg, Oral, Daily, Please take an extra 40mg  a day as needed for swelling or weight gain.   Vitamin D (Ergocalciferol) (DRISDOL) 50,000 Units, Oral, Every 7 days       Objective:   Physical Exam BP 118/77     Temp 98.2 F (36.8 C)    Ht 5\' 4"  (1.626 m)  This is a virtual video visit, she is sitting in her sofa, in no distress, speaking in complete sentences. Becky Stanley her daughter participated in the conversation. I asked her to point the camera to the area of the pressure ulcer, the patient declined .    Assessment     ASSESSMENT (new patient 10-2018 referred by her daughter Becky Stanley) DM HTN High cholesterol Alcoholic sober since 1610, [place] meetings  Anxiety: On sertraline, clonazepam Pulmonary: Asthma, COPD, smoker/vaping ----admitted 06/2017: Acute hypoxic respiratory failure ----CT of the chest in August 2017 which revealed mild groundglass changes primarily in the upper lobes with no noted masses, nodules, or lesions Iron deficiency anemia CV: -CHF DX 06/2017 -HCOM ( avoid excessive diuresis) -Atrial fibrillation, new DX 07/27/2019 -- LE edema L>R DJD Migraines: On hydrocodone as needed Recurrent UTIs: At some point saw ID, Rx Levaquin for 3 days as needed.  She is allergic to Cipro but tolerates Levaquin.  As of 10-2018, UTIs are less frequent. Uses a scooter since hip surgery ~ 2010  PLAN: Recurrent UTI: Current symptoms a started 6 weeks ago, has taking 4 tablets of Levaquin, symptoms are not better. Urinalysis from today consistent with UTI. Plan: Start Bactrim, wait for urine culture to return. She is interested on see a urologist, we will arrange a referral. Pressure ulcer: Per patient description is probably grade 1 or at most 2.  Recommend frequent turning, Desitin, sheepskin.  Previously a home health agency nurse helped tremendously with the management of pressure ulcers and request a referral.  We will do.     I discussed the assessment and treatment plan with the patient. The patient was provided an opportunity to ask questions and all were answered. The patient agreed with the plan and demonstrated an understanding of the instructions.   The patient was advised to call  back or seek an in-person evaluation if the symptoms worsen or if the condition fails to improve as anticipated.

## 2021-08-19 NOTE — Telephone Encounter (Signed)
Opened in error

## 2021-08-20 LAB — URINE CULTURE
MICRO NUMBER:: 12981019
SPECIMEN QUALITY:: ADEQUATE

## 2021-08-20 NOTE — Assessment & Plan Note (Signed)
Recurrent UTI: Current symptoms a started 6 weeks ago, has taking 4 tablets of Levaquin, symptoms are not better. Urinalysis from today consistent with UTI. Plan: Start Bactrim, wait for urine culture to return. She is interested on see a urologist, we will arrange a referral. Pressure ulcer: Per patient description is probably grade 1 or at most 2.  Recommend frequent turning, Desitin, sheepskin.  Previously a home health agency nurse helped tremendously with the management of pressure ulcers and request a referral.  We will do.

## 2021-08-21 ENCOUNTER — Other Ambulatory Visit: Payer: Self-pay | Admitting: Internal Medicine

## 2021-08-21 ENCOUNTER — Telehealth: Payer: Self-pay | Admitting: Internal Medicine

## 2021-08-21 NOTE — Telephone Encounter (Signed)
[facility] [phone]) [facility] called to inform Larose Kells that there will be a delay in the patient's start of care until 2/11

## 2021-08-21 NOTE — Telephone Encounter (Signed)
FYI

## 2021-08-21 NOTE — Telephone Encounter (Signed)
noted 

## 2021-09-10 ENCOUNTER — Other Ambulatory Visit: Payer: Self-pay | Admitting: Internal Medicine

## 2021-09-10 DIAGNOSIS — F419 Anxiety disorder, unspecified: Secondary | ICD-10-CM

## 2021-09-10 NOTE — Telephone Encounter (Signed)
[facility] Pt ? ?Requesting: clonazepam 1mg   ?Contract: 01/08/2020 ?UDS: 08/20/2020 ?Last Visit: 08/19/2021 ?Next Visit: 10/06/2021 ?Last Refill: 02/03/2021 #60 and 2RF ? ?Please Advise ? ?

## 2021-09-11 ENCOUNTER — Encounter: Payer: Self-pay | Admitting: Internal Medicine

## 2021-09-17 DIAGNOSIS — R3915 Urgency of urination: Secondary | ICD-10-CM | POA: Diagnosis not present

## 2021-09-17 DIAGNOSIS — N3020 Other chronic cystitis without hematuria: Secondary | ICD-10-CM | POA: Diagnosis not present

## 2021-09-28 ENCOUNTER — Telehealth: Payer: Self-pay

## 2021-09-28 DIAGNOSIS — Z87891 Personal history of nicotine dependence: Secondary | ICD-10-CM

## 2021-09-28 DIAGNOSIS — G5602 Carpal tunnel syndrome, left upper limb: Secondary | ICD-10-CM

## 2021-09-28 DIAGNOSIS — D509 Iron deficiency anemia, unspecified: Secondary | ICD-10-CM | POA: Diagnosis not present

## 2021-09-28 DIAGNOSIS — F32A Depression, unspecified: Secondary | ICD-10-CM

## 2021-09-28 DIAGNOSIS — I4891 Unspecified atrial fibrillation: Secondary | ICD-10-CM | POA: Diagnosis not present

## 2021-09-28 DIAGNOSIS — M1990 Unspecified osteoarthritis, unspecified site: Secondary | ICD-10-CM

## 2021-09-28 DIAGNOSIS — Z79891 Long term (current) use of opiate analgesic: Secondary | ICD-10-CM

## 2021-09-28 DIAGNOSIS — I110 Hypertensive heart disease with heart failure: Secondary | ICD-10-CM | POA: Diagnosis not present

## 2021-09-28 DIAGNOSIS — Z7984 Long term (current) use of oral hypoglycemic drugs: Secondary | ICD-10-CM

## 2021-09-28 DIAGNOSIS — F1021 Alcohol dependence, in remission: Secondary | ICD-10-CM

## 2021-09-28 DIAGNOSIS — Z87440 Personal history of urinary (tract) infections: Secondary | ICD-10-CM

## 2021-09-28 DIAGNOSIS — G43909 Migraine, unspecified, not intractable, without status migrainosus: Secondary | ICD-10-CM

## 2021-09-28 DIAGNOSIS — I509 Heart failure, unspecified: Secondary | ICD-10-CM | POA: Diagnosis not present

## 2021-09-28 DIAGNOSIS — Z7901 Long term (current) use of anticoagulants: Secondary | ICD-10-CM

## 2021-09-28 DIAGNOSIS — Z6832 Body mass index (BMI) 32.0-32.9, adult: Secondary | ICD-10-CM

## 2021-09-28 DIAGNOSIS — E119 Type 2 diabetes mellitus without complications: Secondary | ICD-10-CM | POA: Diagnosis not present

## 2021-09-28 DIAGNOSIS — K529 Noninfective gastroenteritis and colitis, unspecified: Secondary | ICD-10-CM

## 2021-09-28 DIAGNOSIS — N390 Urinary tract infection, site not specified: Secondary | ICD-10-CM | POA: Diagnosis not present

## 2021-09-28 DIAGNOSIS — K219 Gastro-esophageal reflux disease without esophagitis: Secondary | ICD-10-CM | POA: Diagnosis not present

## 2021-09-28 DIAGNOSIS — Z7951 Long term (current) use of inhaled steroids: Secondary | ICD-10-CM

## 2021-09-28 DIAGNOSIS — E7800 Pure hypercholesterolemia, unspecified: Secondary | ICD-10-CM | POA: Diagnosis not present

## 2021-09-28 DIAGNOSIS — F419 Anxiety disorder, unspecified: Secondary | ICD-10-CM

## 2021-09-28 DIAGNOSIS — J449 Chronic obstructive pulmonary disease, unspecified: Secondary | ICD-10-CM | POA: Diagnosis not present

## 2021-09-28 DIAGNOSIS — L89312 Pressure ulcer of right buttock, stage 2: Secondary | ICD-10-CM | POA: Diagnosis not present

## 2021-09-28 DIAGNOSIS — I422 Other hypertrophic cardiomyopathy: Secondary | ICD-10-CM | POA: Diagnosis not present

## 2021-09-28 DIAGNOSIS — E559 Vitamin D deficiency, unspecified: Secondary | ICD-10-CM | POA: Diagnosis not present

## 2021-09-28 DIAGNOSIS — Z993 Dependence on wheelchair: Secondary | ICD-10-CM

## 2021-09-28 NOTE — Telephone Encounter (Signed)
Plan of care signed and faxed back to [phone], form sent for scanning.  ?

## 2021-10-06 ENCOUNTER — Encounter: Payer: Self-pay | Admitting: Internal Medicine

## 2021-10-06 ENCOUNTER — Telehealth (INDEPENDENT_AMBULATORY_CARE_PROVIDER_SITE_OTHER): Payer: Medicare Other | Admitting: Internal Medicine

## 2021-10-06 VITALS — BP 118/77 | Temp 97.3°F | Ht 64.0 in

## 2021-10-06 DIAGNOSIS — R197 Diarrhea, unspecified: Secondary | ICD-10-CM | POA: Diagnosis not present

## 2021-10-06 DIAGNOSIS — E118 Type 2 diabetes mellitus with unspecified complications: Secondary | ICD-10-CM

## 2021-10-06 DIAGNOSIS — I480 Paroxysmal atrial fibrillation: Secondary | ICD-10-CM

## 2021-10-06 DIAGNOSIS — I5032 Chronic diastolic (congestive) heart failure: Secondary | ICD-10-CM

## 2021-10-06 DIAGNOSIS — R627 Adult failure to thrive: Secondary | ICD-10-CM

## 2021-10-06 MED ORDER — FLUCONAZOLE 150 MG PO TABS: 150.0000 mg | ORAL_TABLET | Freq: Every day | ORAL | 0 refills | Status: DC

## 2021-10-06 NOTE — Progress Notes (Signed)
? ?Subjective:  ? ? Patient ID: Becky Stanley, female    DOB: 1945-12-03, 76 y.o.   MRN: [id number] ? ?DOS:  10/06/2021 ?Type of visit - description: Virtual Visit via Video Note ? ?I connected with the above patient  by a video enabled telemedicine application and verified that I am speaking with the correct person using two identifiers. ?  ?THIS ENCOUNTER IS A VIRTUAL VISIT DUE TO COVID-19 - PATIENT WAS NOT SEEN IN THE OFFICE. PATIENT HAS CONSENTED TO VIRTUAL VISIT / TELEMEDICINE VISIT ?  ?Location of patient: home ? ?Location of provider: office ? ?Persons participating in the virtual visit: patient, provider  ? ?I discussed the limitations of evaluation and management by telemedicine and the availability of in person appointments. The patient expressed understanding and agreed to proceed. ? ?Acute ? ?Discussed multiple issues: ? ?Diarrhea: Started about 4 weeks ago, having loose stools 1-2 times a day.  Yesterday she got 4 bowel movements. ?No fever, + some chills. ?+ Nausea.  No blood in the stools. ?Appetite is poor in general. ?+ Weight loss, noticeable according to the daughter but they actually have no actual weights. ? ?Having a yeast infection according to the patient: ?She has some white vaginal discharge, + pruritus. ?She also complained of some ill-defined vaginal discomfort but denies vaginal bleeding. ? ?Also daughter reports Becky Stanley is losing ground physically.  Request options to help her without going to a nursing home. ? ?The patient herself also reports that she is anxious and afraid to leave the house. ? ?Denies chest pain or difficulty breathing. ?Lower extremity edema is mild. ? ? ?Review of Systems ?See above  ? ?Past Medical History:  ?Diagnosis Date  ? Anxiety and depression   ? Carpal tunnel syndrome, left   ? Chronic UTI, h/o   ? COPD (chronic obstructive pulmonary disease) ([place])   ? Diabetes mellitus ([place])   ? DJD (degenerative joint disease)   ? Essential hypertension   ? GERD  (gastroesophageal reflux disease)   ? Hyperlipidemia   ? Hypertrophic cardiomyopathy ([place])   ? Iron deficiency anemia   ? Vitamin D deficiency   ? ? ?Past Surgical History:  ?Procedure Laterality Date  ? CHOLECYSTECTOMY    ? ESOPHAGEAL DILATION    ? multiple procedures per Pt  ? TOTAL HIP ARTHROPLASTY Bilateral ~2010 ??  ? ? ?Current Outpatient Medications  ?Medication Instructions  ? albuterol (ACCUNEB) 0.63 mg, Nebulization, 4 times daily PRN  ? albuterol (VENTOLIN HFA) 108 (90 Base) MCG/ACT inhaler 1-2 puffs, Inhalation, Every 4 hours PRN  ? alum & mag hydroxide-simeth (MAALOX/MYLANTA) 200-200-20 MG/5ML suspension 15 mLs, Oral, Every 6 hours PRN  ? clonazePAM (KLONOPIN) 1 MG tablet TAKE 1 TABLET BY MOUTH 2 TIMES DAILY AS NEEDED FOR ANXIETY.  ? Eliquis 5 mg, Oral, 2 times daily  ? esomeprazole (NEXIUM) 20 mg, Oral, 2 times daily before meals  ? Fluticasone-Umeclidin-Vilant (TRELEGY ELLIPTA) 100-62.5-25 MCG/INH AEPB 1 puff, Inhalation, Daily  ? Fluticasone-Umeclidin-Vilant (TRELEGY ELLIPTA) 100-62.5-25 MCG/INH AEPB 1 puff, Inhalation, Daily  ? glucose blood (ONETOUCH VERIO) test strip USE TO CHECK BLOOD SUGAR ONCE DAILY  ? levofloxacin (LEVAQUIN) 250 mg, Oral, Daily PRN, x3 days each episode of UTI  ? lovastatin (MEVACOR) 40 MG tablet TAKE 1 TABLET BY MOUTH EVERYDAY AT BEDTIME  ? metFORMIN (GLUCOPHAGE-XR) 500 MG 24 hr tablet TAKE 2 TABLETS BY MOUTH TWICE A DAY  ? metoprolol tartrate (LOPRESSOR) 100 MG tablet TAKE 2 TABLETS BY MOUTH 2 TIMES DAILY.  ?  mupirocin ointment (BACTROBAN) 2 % 1 application., Topical, 2 times daily PRN  ? nystatin (MYCOSTATIN) 500,000 Units, Oral, 4 times daily, As needed for thrush  ? nystatin ointment (MYCOSTATIN) 1 application., Topical, 2 times daily  ? OneTouch Delica Lancets 10X MISC Check blood sugars once daily  ? potassium chloride SA (KLOR-CON M20) 20 MEQ tablet 20 mEq, Oral, Daily  ? Probiotic Product (PROBIOTIC-10 PO) 1 capsule, Oral, Daily  ? sertraline (ZOLOFT) 100 MG tablet  TAKE 1.5 TABLETS ('150MG'$  TOTAL) BY MOUTH DAILY  ? sulfamethoxazole-trimethoprim (BACTRIM DS) 800-160 MG tablet 1 tablet, Oral, 2 times daily  ? terbinafine (LAMISIL) 1 % cream 1 application., Topical, 2 times daily  ? Torsemide 40 mg, Oral, Daily, Please take an extra '40mg'$  a day as needed for swelling or weight gain.  ? Vitamin D (Ergocalciferol) (DRISDOL) 50,000 Units, Oral, Every 7 days  ? ? ?   ?Objective:  ? Physical Exam ?BP 118/77   Temp (!) 97.3 ?F (36.3 ?C)   Ht '5\' 4"'$  (1.626 m)   SpO2 95%  ?This is a virtual video visit, elderly patient, frail, nontoxic-appearing.  Somewhat anxious. ?Speaking in complete sentences with no shortness of breath. ?Daughter also participate in the video visit. ?Weight not available. ? ?   ?Assessment   ? ?ASSESSMENT (new patient 10-2018 referred by her daughter Becky Stanley) ?DM ?HTN ?High cholesterol ?Alcoholic sober since 3235, [place] meetings  ?Anxiety: On sertraline, clonazepam ?Pulmonary: ?Asthma, COPD, smoker/vaping ?----admitted 06/2017: Acute hypoxic respiratory failure ?----CT of the chest in August 2017 which revealed mild groundglass changes primarily in the upper lobes with no noted masses, nodules, or lesions ?Iron deficiency anemia ?CV: ?-CHF DX 06/2017 ?-HCOM ( avoid excessive diuresis) ?-Atrial fibrillation, new DX 07/27/2019 ?-- LE edema L>R ?DJD ?Migraines: On hydrocodone as needed ?Recurrent UTIs: At some point saw ID, Rx Levaquin for 3 days as needed.  She is allergic to Cipro but tolerates Levaquin.  As of 10-2018, UTIs are less frequent. ?Uses a scooter since hip surgery ~ 2010 ? ?PLAN: ?Diarrhea: As described above, associated nausea, no blood in the stools, + weight loss and decreased appetite. ?Suspect C diff versus others.  We will get GI panel.  CBC, TSH.  Further advised with results. ?Yeast infection? ?Recommend Diflucan x2. ?Also she requests general labs regards her chronic medical issues: ?DM: Diet controlled, check A1c ?HTN, CHF, history of A-fib: ?Note  gaining weight, reports mild edema.  Anticoagulated. ?Continue Eliquis, lovastatin, metoprolol, torsemide, potassium, check a CMP. ?Anxiety: Patient was very emotional today.  Continue sertraline and clonazepam. ?Pressure ulcer: Reportedly resolved, the home health agency stopped following the patient ?UTI: ?Saw urology 09/17/2021: ?- Etiology of recurrent UTIs likely multifactorial. ?- Vaginal estrogen to decrease risk of UTI ?- Stressed the need to add a urine culture if symptoms are present and only take ABX based on results. ?- Might benefit from prophylactic low-dose antibiotic. ?Plan: Levaquin from her medication list ?Failure to thrive, unable to come to the office in person: ?The patient's daughter reports that she is losing ground physically, requesting help. ?The patient herself told me that she is unable to come to the office, I explained that I will do my best to help her but virtual/remote care is not ideal. ?For failure to thrive will refer to palliative care. ?Daughter will call and set up a lab appointment: ?CBC, TSH, GI panel including a C. difficile test, Dx diarrhea.  Daughter to get the container and bring the stool sample when she can. ?A1c  Dx DM ?CMP Dx CHF ? ?  ? ? ?  ?I discussed the assessment and treatment plan with the patient. The patient was provided an opportunity to ask questions and all were answered. The patient agreed with the plan and demonstrated an understanding of the instructions. ?  ?The patient was advised to call back or seek an in-person evaluation if the symptoms worsen or if the condition fails to improve as anticipated. ? ?  ?

## 2021-10-06 NOTE — Assessment & Plan Note (Signed)
Diarrhea: As described above, associated nausea, no blood in the stools, + weight loss and decreased appetite. ?Suspect C diff versus others.  We will get GI panel.  CBC, TSH.  Further advised with results. ?Yeast infection? ?Recommend Diflucan x2. ?Also she requests general labs regards her chronic medical issues: ?DM: Diet controlled, check A1c ?HTN, CHF, history of A-fib: ?Note gaining weight, reports mild edema.  Anticoagulated. ?Continue Eliquis, lovastatin, metoprolol, torsemide, potassium, check a CMP. ?Anxiety: Patient was very emotional today.  Continue sertraline and clonazepam. ?Pressure ulcer: Reportedly resolved, the home health agency stopped following the patient ?UTI: ?Saw urology 09/17/2021: ?- Etiology of recurrent UTIs likely multifactorial. ?- Vaginal estrogen to decrease risk of UTI ?- Stressed the need to add a urine culture if symptoms are present and only take ABX based on results. ?- Might benefit from prophylactic low-dose antibiotic. ?Plan: Levaquin from her medication list ?Failure to thrive, unable to come to the office in person: ?The patient's daughter reports that she is losing ground physically, requesting help. ?The patient herself told me that she is unable to come to the office, I explained that I will do my best to help her but virtual/remote care is not ideal. ?For failure to thrive will refer to palliative care. ?Daughter will call and set up a lab appointment: ?CBC, TSH, GI panel including a C. difficile test, Dx diarrhea.  Daughter to get the container and bring the stool sample when she can. ?A1c Dx DM ?CMP Dx CHF ? ?  ?

## 2021-10-08 ENCOUNTER — Telehealth: Payer: Self-pay

## 2021-10-08 ENCOUNTER — Other Ambulatory Visit (INDEPENDENT_AMBULATORY_CARE_PROVIDER_SITE_OTHER): Payer: Medicare Other

## 2021-10-08 DIAGNOSIS — E118 Type 2 diabetes mellitus with unspecified complications: Secondary | ICD-10-CM

## 2021-10-08 DIAGNOSIS — R197 Diarrhea, unspecified: Secondary | ICD-10-CM | POA: Diagnosis not present

## 2021-10-08 NOTE — Telephone Encounter (Signed)
Attempted to contact patient's daughter [name] to schedule a Palliative Care consult appointment. No answer left a message to return call.  ?

## 2021-10-09 ENCOUNTER — Telehealth: Payer: Self-pay

## 2021-10-09 LAB — COMPREHENSIVE METABOLIC PANEL
ALT: 7 U/L (ref 0–35)
AST: 0 U/L (ref 0–37)
Albumin: 3.5 g/dL (ref 3.5–5.2)
Alkaline Phosphatase: 76 U/L (ref 39–117)
BUN: 31 mg/dL — ABNORMAL HIGH (ref 6–23)
CO2: 23 mEq/L (ref 19–32)
Calcium: 8.8 mg/dL (ref 8.4–10.5)
Chloride: 96 mEq/L (ref 96–112)
Creatinine, Ser: 2.04 mg/dL — ABNORMAL HIGH (ref 0.40–1.20)
GFR: 23.58 mL/min — ABNORMAL LOW (ref >60.00)
Glucose, Bld: 88 mg/dL (ref 70–99)
Potassium: 4.2 mEq/L (ref 3.5–5.1)
Sodium: 133 mEq/L — ABNORMAL LOW (ref 135–145)
Total Bilirubin: 0.8 mg/dL (ref 0.2–1.2)
Total Protein: 6.3 g/dL (ref 6.0–8.3)

## 2021-10-09 LAB — HEMOGLOBIN A1C: Hgb A1c MFr Bld: 7 % — ABNORMAL HIGH (ref 4.6–6.5)

## 2021-10-09 LAB — CBC WITH DIFFERENTIAL/PLATELET
Basophils Absolute: 0.1 10*3/uL (ref 0.0–0.1)
Basophils Relative: 1 % (ref 0.0–3.0)
Eosinophils Absolute: 0.4 10*3/uL (ref 0.0–0.7)
Eosinophils Relative: 2.5 % (ref 0.0–5.0)
HCT: 36.7 % (ref 36.0–46.0)
Hemoglobin: 11.6 g/dL — ABNORMAL LOW (ref 12.0–15.0)
Lymphocytes Relative: 7.8 % — ABNORMAL LOW (ref 12.0–46.0)
Lymphs Abs: 1.1 10*3/uL (ref 0.7–4.0)
MCHC: 31.6 g/dL (ref 30.0–36.0)
MCV: 81.2 fl (ref 78.0–100.0)
Monocytes Absolute: 1 10*3/uL (ref 0.1–1.0)
Monocytes Relative: 7 % (ref 3.0–12.0)
Neutro Abs: 11.8 10*3/uL — ABNORMAL HIGH (ref 1.4–7.7)
Neutrophils Relative %: 81.7 % — ABNORMAL HIGH (ref 43.0–77.0)
Platelets: 398 10*3/uL (ref 150.0–400.0)
RBC: 4.53 Mil/uL (ref 3.87–5.11)
RDW: 20.2 % — ABNORMAL HIGH (ref 11.5–15.5)
WBC: 14.5 10*3/uL — ABNORMAL HIGH (ref 4.0–10.5)

## 2021-10-09 LAB — TSH: TSH: 4.2 u[IU]/mL (ref 0.35–5.50)

## 2021-10-09 NOTE — Telephone Encounter (Signed)
Attempted to contact patient's daughter [name] to schedule a Palliative Care consult appointment. No answer left a message to return call.  ?

## 2021-10-13 ENCOUNTER — Telehealth: Payer: Self-pay | Admitting: Internal Medicine

## 2021-10-13 NOTE — Telephone Encounter (Signed)
Pt called to go over labs, please advise.  ?

## 2021-10-13 NOTE — Telephone Encounter (Signed)
Have not been reviewed by provider yet- he maybe waiting for stool results.  ?

## 2021-10-14 ENCOUNTER — Telehealth: Payer: Self-pay

## 2021-10-14 NOTE — Telephone Encounter (Addendum)
LMOM asking for call back. Med list updated. Also sent results and recommendations via Mychart.  ?

## 2021-10-14 NOTE — Telephone Encounter (Signed)
Spoke with patient's daughter [name] and scheduled a FaceTime Palliative Consult for 10/19/21 @ 12:30 PM.  ? ?Consent obtained; updated Netsmart, Team List and Epic.  ? ? ?

## 2021-10-14 NOTE — Telephone Encounter (Signed)
GFR 25  ?Please call patient.  ?Kidney function is decreased: ?-She needs to stop metformin. ?-Good hydration.   ?-NO  NSAIDs  ?-  The white cells are slightly increased, could be related to a infection, I am waiting to see if the stool tests show bacteria. If fever chills let me know. ?-She takes torsemide 40 mg as needed swelling.  Recommend to take only half tablet if needed. ?-Arrange a office visit in person in 4 weeks.    ?

## 2021-10-19 ENCOUNTER — Other Ambulatory Visit: Payer: Self-pay | Admitting: Cardiovascular Disease

## 2021-10-19 ENCOUNTER — Other Ambulatory Visit: Payer: Medicare Other | Admitting: Internal Medicine

## 2021-10-19 ENCOUNTER — Other Ambulatory Visit: Payer: Self-pay | Admitting: Internal Medicine

## 2021-10-19 ENCOUNTER — Encounter: Payer: Self-pay | Admitting: Internal Medicine

## 2021-10-19 DIAGNOSIS — Z515 Encounter for palliative care: Secondary | ICD-10-CM | POA: Diagnosis not present

## 2021-10-19 DIAGNOSIS — J9611 Chronic respiratory failure with hypoxia: Secondary | ICD-10-CM

## 2021-10-19 DIAGNOSIS — J449 Chronic obstructive pulmonary disease, unspecified: Secondary | ICD-10-CM | POA: Diagnosis not present

## 2021-10-19 DIAGNOSIS — F17290 Nicotine dependence, other tobacco product, uncomplicated: Secondary | ICD-10-CM

## 2021-10-19 DIAGNOSIS — K5903 Drug induced constipation: Secondary | ICD-10-CM | POA: Diagnosis not present

## 2021-10-19 DIAGNOSIS — F32A Depression, unspecified: Secondary | ICD-10-CM | POA: Diagnosis not present

## 2021-10-19 DIAGNOSIS — R110 Nausea: Secondary | ICD-10-CM | POA: Diagnosis not present

## 2021-10-19 DIAGNOSIS — F419 Anxiety disorder, unspecified: Secondary | ICD-10-CM | POA: Diagnosis not present

## 2021-10-19 MED ORDER — DOCUSATE SODIUM 60 MG/15ML PO SYRP: 60.0000 mg | ORAL_SOLUTION | Freq: Every day | ORAL | 0 refills | Status: AC | PRN

## 2021-10-19 MED ORDER — ONDANSETRON HCL 4 MG PO TABS: 4.0000 mg | ORAL_TABLET | Freq: Three times a day (TID) | ORAL | 0 refills | Status: AC | PRN

## 2021-10-19 NOTE — Progress Notes (Signed)
[facility] Consult Note Telephone: [phone]  Fax: [phone]   Date of encounter: 10/19/21 12:32 PM PATIENT NAME: Becky Stanley [address] [place] [address]   [phone] (home)  DOB: [birth year] MRN: [id number]  PRIMARY CARE PROVIDER:    Colon Branch, MD,  [address] [place] [address] [phone]  REFERRING PROVIDER:   Colon Branch, [address] [place],  [place] [address] [phone]  RESPONSIBLE PARTY:    Contact Information     Name Relation Home Work Mobile   Jessy Oto Daughter   [phone]        Palliative Care was asked to follow this patient by consultation request of  Colon Branch, MD to address advance care planning and complex medical decision making. This is the initial visit.                                     ASSESSMENT AND PLAN / RECOMMENDATIONS:   Advance Care Planning/Goals of Care: Goals include to maximize quality of life and symptom management. Patient/health care surrogate gave his/her permission to discuss.Our advance care planning conversation included a discussion about:    The value and importance of advance care planning  Experiences with loved ones who have been seriously ill or have died  Exploration of personal, cultural or spiritual beliefs that might influence medical decisions  Exploration of goals of care in the event of a sudden injury or illness  Identification  of a healthcare agent--they cannot locate the living will and HCPOA (husband was still alive then but passed away from renal failure) Review and updating or creation of an  advance directive document. Pt would like to stay at home indefinitely, but does not like taking so much time and interfering with her daughter and son in-law's marriage and the grandchildren.  They lived in [place] until 3 yrs ago so that was not an issue.  She is staying in Andrea's home now.  Pt says they  are both stubborn (she and [name]).  Pt says if she was in a SNF she would not have to put that added responsibility for her daughter. Pt needs help with her shower, dinner preparation.  Pt puts her meds together, but it's getting harder--double checks them, but SIL is helping by crushing the pills that can be.  She's recently been skipping some pills due to her nausea.   Decision not to resuscitate or to de-escalate disease focused treatments due to poor prognosis. CODE STATUS:  MOST form completed as follows and she will formally sign tomorrow--DNR--based on her personal experience, she would not want to go through CPR b/c of risk of further decline even from where she is (cared for her parents and step-grandmother, an aunt and an uncle); comfort measures, avoid hospitalizations; abx case-by-case--if she is expected to be cured with them, she'd take them; IVFs trial; no tube feeding   Symptom Management/Plan: 1. Nausea -?gastroparesis vs due to her thrush -she has stopped her metformin due to this and diarrhea she'd been having and has d/c'd her trelegy b/c of thrush -continues having nausea 90% of day and eating only high carb/bland foods that are not good for her diabetes but also don't go through her or make her stomach as upset -she and [name] preferred trying symptom mgt next with zofran over  trying to get into an office for more tests unless zofran does not help her -did discuss possible gastroparesis; however, I was hesitant to consider reglan with its side effects first line - ondansetron (ZOFRAN) 4 MG tablet; Take 1 tablet (4 mg total) by mouth every 8 (eight) hours as needed for nausea or vomiting.  Dispense: 30 tablet; Refill: 0  2. Drug-induced constipation -now after imodium and with limited intake for days, she has only had two very small hard bms so opted to try stool softener now and repeat in am if still has not had a reasonable bm - docusate (COLACE) 60 MG/15ML syrup; Take 15 mLs  (60 mg total) by mouth daily as needed.  Dispense: 100 mL; Refill: 0  3. Chronic respiratory failure with hypoxia (HCC) -not using oxygen therapy though it was historically "suggested" by her report -does get dyspneic with minimal exertion -encouraged use of nebs for sob in light of stopping her trelegy  4. Chronic obstructive pulmonary disease, unspecified COPD type ([facility]) -use nebs in place of inhalers at this time   5. Anxiety and depression -continues on SSRI and benzo for these  -due in part to circumstances at this point and does not like for her daughter to have to take care of her   6. Vaping nicotine dependence, tobacco product -ongoing but on lowest nicotine dose--vaping was going on during visit  7. Palliative care by specialist -introduced palliative care roles, completed MOST with pt and daughter (which will be signed when I bring it by tomorrow after seeing other patients), sent list of caregiving agencies and provided education about how medicare and most insurances are not covering this service except long-term care insurance and medicaid  -also connected [name] with our social worker for further support  Follow up Palliative Care Visit: Palliative care will continue to follow for complex medical decision making, advance care planning, and clarification of goals. Return 6 weeks or prn.  11-Dec-2021  This visit was coded based on medical decision making (MDM). 22 minutes spent on ACP.  PPS: 40%  HOSPICE ELIGIBILITY/DIAGNOSIS: chronic respiratory failure/COPD  Chief Complaint: initial palliative consult  HISTORY OF PRESENT ILLNESS:  Becky Stanley is a 76 y.o. year old female  with COPD, chronic respiratory failure, HOCM, chronic diastolic heart failure, alcohol abuse, pafib, iron deficiency, DMII urinary incontinence, GERD, tobacco abuse (now vaping with nicotine), erythrocytosis, chronic UTI, htn, anxiety and depression. She was referred by Dr. Larose Kells, her PCP.  In the  last month, pt has taken a downturn, and she's not sure why.  She's nauseated all of the time except rare bits of time when she can eat.  The few things she can get down are not real nutritious.  She had horrible diarrhea for several days.  Took imodium and can't do more than 2 or she gets stopped up.  Now she's very constipated and miserable.  Two extremes. She's been eating a lot of noodles lately.  Cannot chew most things and has to eat very soft--does love veggies.    She has a good friend who is a Marine scientist and her husband had the same symptoms.  Pt was on metformin for years also.  She's off the metformin for one week but she does not feel much better.    She also was on trelegy but her mouth was getting painful--burning so she stopped it.  She was rinsing her mouth and brushing the teeth she has left.  It looks like Dr. Larose Kells  gave her diflucan on 3/28.  She reports that she's done better off the trelegy.She was getting really bad indigestion also that was not relieved as it usually was by nexium bid.    Sleep only 3-4 hrs last night which is actually a lot for her.  Some nights, not sleeping at all.  She got hard peppermint candies to help her belly and taking chips of those throughout the day which helps just a little.   Takes several pills in am and several at night.  Can only swallow tiny pills or slippery ones like nexium.  She has terrible dry mouth all of the time.    Over the past week, [name], her daughter, shares that she was using a handicapped cart and could transfer, but this week, she's too weak to do this w/o help.  Also [name] is helping her get in and out of the shower--only taking 2-3 steps.  Now needing 24x7 coverage.    Not only is she nauseous, but having early satiety.    It's gotten where she cannot get up and out of the home.   She's a little more sob without trelegy.    Still vaping.  She quit smoking for 30 yrs, then they had a family strategy and she started up again.   Got widdled down to lowest vaping she could do.    She's quite depressed--takes sertraline and klonopin, but pills may not be staying down.    Had a very hard small bm yesterday and another solid tiny bm a couple days before.  Feels like she's full.    History obtained from review of EMR, discussion with primary team, and interview with family, facility staff/caregiver and/or Ms. Klier.   I reviewed available labs, medications, imaging, studies and related documents from the EMR.  Records reviewed and summarized above.   ROS General: NAD EYES: denies vision changes ENMT: denies dysphagia Cardiovascular: denies chest pain, has DOE Pulmonary: has cough, has increased SOB Abdomen: endorses poor appetite, nausea and diarrhea but now constipated from imodium,  endorses continence of bowel GU: denies dysuria, endorses incontinence of urine MSK:  has increased weakness,  no falls reported Skin: denies rashes or wounds Neurological: denies pain, denies insomnia Psych: Endorses sadness  Heme/lymph/immuno: denies bruises, abnormal bleeding  Physical Exam: Current and past weights:  pt has refused to weigh throughout her epic chart Constitutional: NAD General: pale, obese, appears exhausted EYES: anicteric sclera, lids intact, no discharge  ENMT: intact hearing, oral mucous membranes dry, multiple missing teeth CV: no jvd noted Pulmonary: no increased work of breathing, dry cough, room air Abdomen: intake 25% at best MSK: has sarcopenia, moves all extremities, only able to take a few steps now due to weakness Skin: no rashes or wounds on visible skin, pale Neuro: generalized weakness,  no cognitive impairment Psych: non-anxious affect, A and O x 3, near tears at times Hem/lymph/immuno: no widespread bruising  CURRENT PROBLEM LIST:  Patient Active Problem List   Diagnosis Date Noted   Chronic respiratory failure with hypoxia ([place]) 03/05/2021   Goals of care, counseling/discussion  01/09/2020   Paroxysmal atrial fibrillation ([place]) 08/30/2019   Alcohol abuse, in remission 06/16/2019   Hyperlipidemia associated with type 2 diabetes mellitus ([place]) 02/04/2019   PCP NOTES >>>>>>>>>>>>>>>>>>>>> 10/26/2018   Chronic diastolic heart failure ([place]) 10/17/2017   HOCM (hypertrophic obstructive cardiomyopathy) ([place]) 10/17/2017   Class 3 severe obesity due to excess calories with serious comorbidity and body mass index (BMI) of 40.0  to 44.9 in adult [facility]) 09/13/2017   COPD (chronic obstructive pulmonary disease) ([place]) 09/10/2017   Erythrocytosis 05/08/2015   Tobacco abuse 05/08/2015   Anxiety and depression 05/29/2010   Benign essential hypertension 05/29/2010   Chronic UTI 05/29/2010   Esophageal reflux 05/29/2010   Iron deficiency anemia due to chronic blood loss 05/29/2010   Vitamin D deficiency 05/29/2010   Urge incontinence 05/29/2010   Controlled diabetes mellitus type 2 with complications ([place]) 34/19/6222   Other and unspecified hyperlipidemia 05/29/2010   PAST MEDICAL HISTORY:  Active Ambulatory Problems    Diagnosis Date Noted   Anxiety and depression 05/29/2010   Benign essential hypertension 05/29/2010   Chronic diastolic heart failure ([place]) 10/17/2017   Chronic UTI 05/29/2010   Class 3 severe obesity due to excess calories with serious comorbidity and body mass index (BMI) of 40.0 to 44.9 in adult ([place]) 09/13/2017   COPD (chronic obstructive pulmonary disease) ([facility]) 09/10/2017   Erythrocytosis 05/08/2015   Esophageal reflux 05/29/2010   HOCM (hypertrophic obstructive cardiomyopathy) ([place]) 10/17/2017   Iron deficiency anemia due to chronic blood loss 05/29/2010   Vitamin D deficiency 05/29/2010   Urge incontinence 05/29/2010   Controlled diabetes mellitus type 2 with complications ([place]) 97/98/9211   Tobacco abuse 05/08/2015   Other and unspecified hyperlipidemia 05/29/2010   PCP NOTES >>>>>>>>>>>>>>>>>>>>> 10/26/2018   Hyperlipidemia associated with type 2  diabetes mellitus ([place]) 02/04/2019   Alcohol abuse, in remission 06/16/2019   Paroxysmal atrial fibrillation ([place]) 08/30/2019   Goals of care, counseling/discussion 01/09/2020   Chronic respiratory failure with hypoxia ([place]) 03/05/2021   Resolved Ambulatory Problems    Diagnosis Date Noted   No Resolved Ambulatory Problems   Past Medical History:  Diagnosis Date   Carpal tunnel syndrome, left    Diabetes mellitus (HCC)    DJD (degenerative joint disease)    Essential hypertension    GERD (gastroesophageal reflux disease)    Hyperlipidemia    Hypertrophic cardiomyopathy (HCC)    Iron deficiency anemia    SOCIAL HX:  Social History   Tobacco Use   Smoking status: Former    Packs/day: 1.00    Years: 35.00    Pack years: 35.00    Types: E-cigarettes, Cigarettes    Start date: 07/12/1958    Quit date: 09/10/1998    Years since quitting: 23.1   Smokeless tobacco: Never   Tobacco comments:    quit 20 years, started back about 5 years ago, vaping  Substance Use Topics   Alcohol use: Never    Comment: h/o ETOH abuse    FAMILY HX:  Family History  Problem Relation Age of Onset   Colon cancer Father 4   COPD Father    CAD Paternal Grandfather    Multiple sclerosis Mother    Breast cancer Neg Hx       ALLERGIES:  Allergies  Allergen Reactions   Aminoglycosides    Ciprofloxacin Nausea And Vomiting    Tolerates levaquin   Erythromycin Nausea And Vomiting   Nitrofurantoin Nausea And Vomiting   Other Nausea And Vomiting    [place]   Penicillins Hives     PERTINENT MEDICATIONS:  Outpatient Encounter Medications as of 10/19/2021  Medication Sig   albuterol (ACCUNEB) 0.63 MG/3ML nebulizer solution Take 3 mLs (0.63 mg total) by nebulization 4 (four) times daily as needed for wheezing or shortness of breath.   albuterol (VENTOLIN HFA) 108 (90 Base) MCG/ACT inhaler Inhale 1-2 puffs into the lungs every 4 (four) hours as  needed for wheezing or shortness of breath.   alum & mag  hydroxide-simeth (MAALOX/MYLANTA) 200-200-20 MG/5ML suspension Take 15 mLs by mouth every 6 (six) hours as needed for indigestion or heartburn.   apixaban (ELIQUIS) 5 MG TABS tablet Take 1 tablet (5 mg total) by mouth 2 (two) times daily.   clonazePAM (KLONOPIN) 1 MG tablet TAKE 1 TABLET BY MOUTH 2 TIMES DAILY AS NEEDED FOR ANXIETY.   esomeprazole (NEXIUM) 20 MG capsule Take 20 mg by mouth 2 (two) times daily before a meal.   fluconazole (DIFLUCAN) 150 MG tablet Take 1 tablet (150 mg total) by mouth daily. For 2 days   Fluticasone-Umeclidin-Vilant (TRELEGY ELLIPTA) 100-62.5-25 MCG/INH AEPB Inhale 1 puff into the lungs daily.   Fluticasone-Umeclidin-Vilant (TRELEGY ELLIPTA) 100-62.5-25 MCG/INH AEPB Inhale 1 puff into the lungs daily.   glucose blood (ONETOUCH VERIO) test strip USE TO CHECK BLOOD SUGAR ONCE DAILY   lovastatin (MEVACOR) 40 MG tablet TAKE 1 TABLET BY MOUTH EVERYDAY AT BEDTIME   metoprolol tartrate (LOPRESSOR) 100 MG tablet TAKE 2 TABLETS BY MOUTH 2 TIMES DAILY.   mupirocin ointment (BACTROBAN) 2 % Apply 1 application topically 2 (two) times daily as needed.   nystatin (MYCOSTATIN) 100000 UNIT/ML suspension Take 5 mLs (500,000 Units total) by mouth 4 (four) times daily. As needed for thrush   nystatin ointment (MYCOSTATIN) Apply 1 application topically 2 (two) times daily.   OneTouch Delica Lancets 40J MISC Check blood sugars once daily   potassium chloride SA (KLOR-CON M20) 20 MEQ tablet Take 1 tablet (20 mEq total) by mouth daily.   Probiotic Product (PROBIOTIC-10 PO) Take 1 capsule by mouth daily.   sertraline (ZOLOFT) 100 MG tablet TAKE 1.5 TABLETS ('150MG'$  TOTAL) BY MOUTH DAILY   terbinafine (LAMISIL) 1 % cream Apply 1 application topically 2 (two) times daily.   Torsemide 40 MG TABS Take 20 mg by mouth daily.   Vitamin D, Ergocalciferol, (DRISDOL) 1.25 MG (50000 UNIT) CAPS capsule TAKE 1 CAPSULE (50,000 UNITS TOTAL) BY MOUTH EVERY 7 (SEVEN) DAYS   No facility-administered  encounter medications on file as of 10/19/2021.   Thank you for the opportunity to participate in the care of Ms. Galvan.  The palliative care team will continue to follow. Please call our office at [phone] if we can be of additional assistance.   Hollace Kinnier, DO   COVID-19 PATIENT SCREENING TOOL Asked and negative response unless otherwise noted:  Have you had symptoms of covid, tested positive or been in contact with someone with symptoms/positive test in the past 5-10 days? no

## 2021-10-23 ENCOUNTER — Telehealth: Payer: Self-pay | Admitting: Internal Medicine

## 2021-10-23 NOTE — Telephone Encounter (Signed)
Diarrhea has been ongoing, saw Dr. Larose Kells on 10/06/21 and was supposed to drop off stool samples for testing which she has not done- recommend she do that at her earliest convenience.  ?

## 2021-10-23 NOTE — Telephone Encounter (Signed)
Pt called stating she needed to schedule an appointment for horrible diarrhea + nausea. Advised patient that all providers are booked for the day and Dr. Larose Kells (PCP) is currently booked through next week as well. Advised patient that we would see if there was any way Dr. Larose Kells could work her in to address her symptoms. Please advise.  ?

## 2021-10-26 ENCOUNTER — Encounter (HOSPITAL_COMMUNITY): Payer: Self-pay | Admitting: Emergency Medicine

## 2021-10-26 ENCOUNTER — Emergency Department (HOSPITAL_COMMUNITY)
Admission: EM | Admit: 2021-10-26 | Discharge: 2021-10-26 | Discharge status: Against Medical Advice | Payer: Medicare Other | Attending: Physician Assistant | Admitting: Physician Assistant

## 2021-10-26 ENCOUNTER — Other Ambulatory Visit: Payer: Self-pay

## 2021-10-26 DIAGNOSIS — R82998 Other abnormal findings in urine: Secondary | ICD-10-CM | POA: Insufficient documentation

## 2021-10-26 DIAGNOSIS — R197 Diarrhea, unspecified: Secondary | ICD-10-CM | POA: Diagnosis not present

## 2021-10-26 DIAGNOSIS — K5900 Constipation, unspecified: Secondary | ICD-10-CM | POA: Diagnosis not present

## 2021-10-26 DIAGNOSIS — R1084 Generalized abdominal pain: Secondary | ICD-10-CM | POA: Diagnosis present

## 2021-10-26 DIAGNOSIS — Z5321 Procedure and treatment not carried out due to patient leaving prior to being seen by health care provider: Secondary | ICD-10-CM | POA: Diagnosis not present

## 2021-10-26 LAB — COMPREHENSIVE METABOLIC PANEL
ALT: 11 U/L (ref 0–44)
AST: 14 U/L — ABNORMAL LOW (ref 15–41)
Albumin: 2.4 g/dL — ABNORMAL LOW (ref 3.5–5.0)
Alkaline Phosphatase: 91 U/L (ref 38–126)
Anion gap: 13 (ref 5–15)
BUN: 46 mg/dL — ABNORMAL HIGH (ref 8–23)
CO2: 18 mmol/L — ABNORMAL LOW (ref 22–32)
Calcium: 8.4 mg/dL — ABNORMAL LOW (ref 8.9–10.3)
Chloride: 97 mmol/L — ABNORMAL LOW (ref 98–111)
Creatinine, Ser: 3.6 mg/dL — ABNORMAL HIGH (ref 0.44–1.00)
GFR, Estimated: 13 mL/min — ABNORMAL LOW (ref >60)
Glucose, Bld: 148 mg/dL — ABNORMAL HIGH (ref 70–99)
Potassium: 3.4 mmol/L — ABNORMAL LOW (ref 3.5–5.1)
Sodium: 128 mmol/L — ABNORMAL LOW (ref 135–145)
Total Bilirubin: 0.8 mg/dL (ref 0.3–1.2)
Total Protein: 6.1 g/dL — ABNORMAL LOW (ref 6.5–8.1)

## 2021-10-26 LAB — CBC WITH DIFFERENTIAL/PLATELET
Abs Immature Granulocytes: 0.14 10*3/uL — ABNORMAL HIGH (ref 0.00–0.07)
Basophils Absolute: 0.1 10*3/uL (ref 0.0–0.1)
Basophils Relative: 0 %
Eosinophils Absolute: 0.4 10*3/uL (ref 0.0–0.5)
Eosinophils Relative: 2 %
HCT: 36.9 % (ref 36.0–46.0)
Hemoglobin: 11.8 g/dL — ABNORMAL LOW (ref 12.0–15.0)
Immature Granulocytes: 1 %
Lymphocytes Relative: 5 %
Lymphs Abs: 1 10*3/uL (ref 0.7–4.0)
MCH: 25.9 pg — ABNORMAL LOW (ref 26.0–34.0)
MCHC: 32 g/dL (ref 30.0–36.0)
MCV: 80.9 fL (ref 80.0–100.0)
Monocytes Absolute: 1.2 10*3/uL — ABNORMAL HIGH (ref 0.1–1.0)
Monocytes Relative: 6 %
Neutro Abs: 16.5 10*3/uL — ABNORMAL HIGH (ref 1.7–7.7)
Neutrophils Relative %: 86 %
Platelets: 344 10*3/uL (ref 150–400)
RBC: 4.56 MIL/uL (ref 3.87–5.11)
RDW: 18.2 % — ABNORMAL HIGH (ref 11.5–15.5)
WBC: 19.3 10*3/uL — ABNORMAL HIGH (ref 4.0–10.5)
nRBC: 0 % (ref 0.0–0.2)

## 2021-10-26 NOTE — ED Provider Triage Note (Signed)
Emergency Medicine Provider Triage Evaluation Note ? ?Becky Stanley , a 76 y.o. female  was evaluated in triage.  Pt complains of abdominal pain, constipation and dark urine  ? ?Review of Systems  ?Positive: Abdominal pain  ?Negative: fever ? ?Physical Exam  ?BP (!) 117/50 (BP Location: Right Arm)   Pulse 90   Resp 16   SpO2 92%  ?Gen:   Awake, no distress   ?Resp:  Normal effort  ?MSK:   Moves extremities without difficulty  ?Other:   ? ?Medical Decision Making  ?Medically screening exam initiated at 11:39 AM.  Appropriate orders placed.  Becky Stanley was informed that the remainder of the evaluation will be completed by another provider, this initial triage assessment does not replace that evaluation, and the importance of remaining in the ED until their evaluation is complete. ? ? ?  ?Fransico Meadow, [place] ?10/26/21 1142 ? ?

## 2021-10-26 NOTE — ED Triage Notes (Signed)
Pt reports nausea and diarrhea that started 1 month ago and lasted 2 weeks.  Took imodium and now constipated x 1 week.  Reports brown urine and generalized abd pain x 1 month. ?

## 2021-10-26 NOTE — ED Notes (Signed)
Attempted to get a temp on the pt, she couldn't tolerate the thermometer in her mouth and pulled it out. Couldn't get an axillary temp to read, RN notified.  ?

## 2021-10-26 NOTE — Telephone Encounter (Signed)
Went to call pt back to set up getting that stool sample. Noticed that she has been admitted to the ED.  ?

## 2021-10-26 NOTE — ED Notes (Signed)
Pt no longer wanted to wait. Family member stated that she could possible get a doctors appointment for pt tomorrow ?

## 2021-10-27 ENCOUNTER — Ambulatory Visit: Payer: Medicare Other | Admitting: Family Medicine

## 2021-10-27 DIAGNOSIS — Z7401 Bed confinement status: Secondary | ICD-10-CM | POA: Diagnosis not present

## 2021-10-27 DIAGNOSIS — D494 Neoplasm of unspecified behavior of bladder: Secondary | ICD-10-CM | POA: Diagnosis not present

## 2021-10-27 DIAGNOSIS — N1330 Unspecified hydronephrosis: Secondary | ICD-10-CM | POA: Diagnosis not present

## 2021-10-27 DIAGNOSIS — R627 Adult failure to thrive: Secondary | ICD-10-CM | POA: Diagnosis not present

## 2021-10-27 DIAGNOSIS — N3289 Other specified disorders of bladder: Secondary | ICD-10-CM | POA: Diagnosis not present

## 2021-10-27 DIAGNOSIS — Z515 Encounter for palliative care: Secondary | ICD-10-CM | POA: Diagnosis not present

## 2021-10-27 DIAGNOSIS — N320 Bladder-neck obstruction: Secondary | ICD-10-CM | POA: Diagnosis not present

## 2021-10-27 DIAGNOSIS — I82452 Acute embolism and thrombosis of left peroneal vein: Secondary | ICD-10-CM | POA: Diagnosis not present

## 2021-10-27 DIAGNOSIS — R531 Weakness: Secondary | ICD-10-CM | POA: Diagnosis not present

## 2021-10-27 DIAGNOSIS — I4891 Unspecified atrial fibrillation: Secondary | ICD-10-CM | POA: Diagnosis not present

## 2021-10-27 DIAGNOSIS — R339 Retention of urine, unspecified: Secondary | ICD-10-CM | POA: Diagnosis not present

## 2021-10-27 DIAGNOSIS — R7989 Other specified abnormal findings of blood chemistry: Secondary | ICD-10-CM | POA: Diagnosis not present

## 2021-10-27 DIAGNOSIS — E119 Type 2 diabetes mellitus without complications: Secondary | ICD-10-CM | POA: Diagnosis not present

## 2021-10-27 DIAGNOSIS — F419 Anxiety disorder, unspecified: Secondary | ICD-10-CM | POA: Diagnosis not present

## 2021-10-27 DIAGNOSIS — I10 Essential (primary) hypertension: Secondary | ICD-10-CM | POA: Diagnosis not present

## 2021-10-27 DIAGNOSIS — K5900 Constipation, unspecified: Secondary | ICD-10-CM | POA: Diagnosis not present

## 2021-10-27 DIAGNOSIS — M79605 Pain in left leg: Secondary | ICD-10-CM | POA: Diagnosis not present

## 2021-10-27 DIAGNOSIS — I82402 Acute embolism and thrombosis of unspecified deep veins of left lower extremity: Secondary | ICD-10-CM | POA: Diagnosis not present

## 2021-10-27 DIAGNOSIS — I5032 Chronic diastolic (congestive) heart failure: Secondary | ICD-10-CM | POA: Diagnosis not present

## 2021-10-27 DIAGNOSIS — M7989 Other specified soft tissue disorders: Secondary | ICD-10-CM | POA: Diagnosis not present

## 2021-10-27 DIAGNOSIS — K632 Fistula of intestine: Secondary | ICD-10-CM | POA: Diagnosis not present

## 2021-10-27 DIAGNOSIS — N390 Urinary tract infection, site not specified: Secondary | ICD-10-CM | POA: Diagnosis not present

## 2021-10-27 DIAGNOSIS — J80 Acute respiratory distress syndrome: Secondary | ICD-10-CM | POA: Diagnosis not present

## 2021-10-27 DIAGNOSIS — R000 Tachycardia, unspecified: Secondary | ICD-10-CM | POA: Diagnosis not present

## 2021-10-27 DIAGNOSIS — J449 Chronic obstructive pulmonary disease, unspecified: Secondary | ICD-10-CM | POA: Diagnosis present

## 2021-10-27 DIAGNOSIS — Z7984 Long term (current) use of oral hypoglycemic drugs: Secondary | ICD-10-CM | POA: Diagnosis not present

## 2021-10-27 DIAGNOSIS — C678 Malignant neoplasm of overlapping sites of bladder: Secondary | ICD-10-CM | POA: Diagnosis not present

## 2021-10-27 DIAGNOSIS — T476X5A Adverse effect of antidiarrheal drugs, initial encounter: Secondary | ICD-10-CM | POA: Diagnosis not present

## 2021-10-27 DIAGNOSIS — F32A Depression, unspecified: Secondary | ICD-10-CM | POA: Diagnosis not present

## 2021-10-27 DIAGNOSIS — E871 Hypo-osmolality and hyponatremia: Secondary | ICD-10-CM | POA: Diagnosis not present

## 2021-10-27 DIAGNOSIS — Z66 Do not resuscitate: Secondary | ICD-10-CM | POA: Diagnosis not present

## 2021-10-27 DIAGNOSIS — R112 Nausea with vomiting, unspecified: Secondary | ICD-10-CM | POA: Diagnosis not present

## 2021-10-27 DIAGNOSIS — I110 Hypertensive heart disease with heart failure: Secondary | ICD-10-CM | POA: Diagnosis not present

## 2021-10-27 DIAGNOSIS — E118 Type 2 diabetes mellitus with unspecified complications: Secondary | ICD-10-CM | POA: Diagnosis present

## 2021-10-27 DIAGNOSIS — Z7901 Long term (current) use of anticoagulants: Secondary | ICD-10-CM | POA: Diagnosis not present

## 2021-10-27 DIAGNOSIS — T45516A Underdosing of anticoagulants, initial encounter: Secondary | ICD-10-CM | POA: Diagnosis present

## 2021-10-27 DIAGNOSIS — Z96643 Presence of artificial hip joint, bilateral: Secondary | ICD-10-CM | POA: Diagnosis not present

## 2021-10-27 DIAGNOSIS — K439 Ventral hernia without obstruction or gangrene: Secondary | ICD-10-CM | POA: Diagnosis not present

## 2021-10-27 DIAGNOSIS — I700 Atherosclerosis of aorta: Secondary | ICD-10-CM | POA: Diagnosis not present

## 2021-10-27 DIAGNOSIS — E876 Hypokalemia: Secondary | ICD-10-CM | POA: Diagnosis present

## 2021-10-27 DIAGNOSIS — N136 Pyonephrosis: Secondary | ICD-10-CM | POA: Diagnosis present

## 2021-10-27 DIAGNOSIS — I959 Hypotension, unspecified: Secondary | ICD-10-CM | POA: Diagnosis not present

## 2021-10-27 DIAGNOSIS — I82442 Acute embolism and thrombosis of left tibial vein: Secondary | ICD-10-CM | POA: Diagnosis not present

## 2021-10-27 DIAGNOSIS — N1339 Other hydronephrosis: Secondary | ICD-10-CM | POA: Diagnosis not present

## 2021-10-27 DIAGNOSIS — I480 Paroxysmal atrial fibrillation: Secondary | ICD-10-CM | POA: Diagnosis present

## 2021-10-27 DIAGNOSIS — I824Z2 Acute embolism and thrombosis of unspecified deep veins of left distal lower extremity: Secondary | ICD-10-CM | POA: Diagnosis not present

## 2021-10-27 DIAGNOSIS — N179 Acute kidney failure, unspecified: Secondary | ICD-10-CM | POA: Diagnosis not present

## 2021-10-27 DIAGNOSIS — R0902 Hypoxemia: Secondary | ICD-10-CM | POA: Diagnosis not present

## 2021-10-27 DIAGNOSIS — C679 Malignant neoplasm of bladder, unspecified: Secondary | ICD-10-CM | POA: Diagnosis not present

## 2021-10-27 DIAGNOSIS — R5383 Other fatigue: Secondary | ICD-10-CM | POA: Diagnosis not present

## 2021-10-27 DIAGNOSIS — I5030 Unspecified diastolic (congestive) heart failure: Secondary | ICD-10-CM | POA: Diagnosis not present

## 2021-10-27 DIAGNOSIS — Z936 Other artificial openings of urinary tract status: Secondary | ICD-10-CM | POA: Diagnosis not present

## 2021-10-30 HISTORY — PX: TRANSURETHRAL RESECTION OF BLADDER TUMOR: SHX2575

## 2021-10-31 HISTORY — PX: NEPHROSTOMY TUBE PLACEMENT (ARMC HX): HXRAD1726

## 2021-11-02 ENCOUNTER — Telehealth: Payer: Self-pay | Admitting: Internal Medicine

## 2021-11-02 NOTE — Telephone Encounter (Signed)
Becky Stanley called to let me know what was going on with her mom.  I had met with them extensively to complete her MOST form and establish her goals of care.  She was hospitalized at [facility] with blood noted from either her rectum or an anterior orifice.  She was determined by imaging to have a bladder mass and hydroureternephrosis as well.  She had a biopsy which is pending.  I discussed with Becky Stanley that I had just notified our hospital liaisons that pt was there and asked them to follow up; however, pt's daughter reports that hospital is recommending a move to a hospice facility and a friend of theirs recommended the [facility] hospice home. Becky Stanley said she wanted me to still follow her mother, but that would not be the case if she goes on hospice either way as I'm with the palliative care team only.  I did provide education about [employer] and patient choice about providers and hospice location options.  ? ?

## 2021-11-03 DIAGNOSIS — K632 Fistula of intestine: Secondary | ICD-10-CM | POA: Insufficient documentation

## 2021-11-03 DIAGNOSIS — C679 Malignant neoplasm of bladder, unspecified: Secondary | ICD-10-CM | POA: Insufficient documentation

## 2021-11-09 DEATH — deceased

## 2021-11-20 ENCOUNTER — Ambulatory Visit: Payer: Medicare Other | Admitting: Cardiovascular Disease

## 2021-12-03 ENCOUNTER — Other Ambulatory Visit: Payer: Medicare Other | Admitting: Internal Medicine

## 2022-01-28 ENCOUNTER — Other Ambulatory Visit: Payer: Self-pay | Admitting: Internal Medicine

## 2022-02-26 ENCOUNTER — Other Ambulatory Visit: Payer: Self-pay | Admitting: Internal Medicine

## 2022-03-12 ENCOUNTER — Other Ambulatory Visit: Payer: Self-pay | Admitting: Internal Medicine
